# Patient Record
Sex: Male | Born: 1945 | Race: Black or African American | Hispanic: No | Marital: Single | Smoking: Current every day smoker
Health system: Southern US, Community
[De-identification: ages and names within clinical notes are randomized; demographics above are authoritative.]

## PROBLEM LIST (undated history)

## (undated) DIAGNOSIS — E210 Primary hyperparathyroidism: Secondary | ICD-10-CM

## (undated) DIAGNOSIS — R600 Localized edema: Secondary | ICD-10-CM

## (undated) DIAGNOSIS — R252 Cramp and spasm: Secondary | ICD-10-CM

## (undated) DIAGNOSIS — M79604 Pain in right leg: Secondary | ICD-10-CM

## (undated) DIAGNOSIS — R000 Tachycardia, unspecified: Secondary | ICD-10-CM

## (undated) DIAGNOSIS — M1990 Unspecified osteoarthritis, unspecified site: Secondary | ICD-10-CM

## (undated) DIAGNOSIS — Z720 Tobacco use: Secondary | ICD-10-CM

## (undated) DIAGNOSIS — E876 Hypokalemia: Secondary | ICD-10-CM

## (undated) DIAGNOSIS — I10 Essential (primary) hypertension: Secondary | ICD-10-CM

## (undated) DIAGNOSIS — N400 Enlarged prostate without lower urinary tract symptoms: Secondary | ICD-10-CM

## (undated) DIAGNOSIS — H409 Unspecified glaucoma: Secondary | ICD-10-CM

## (undated) DIAGNOSIS — D7589 Other specified diseases of blood and blood-forming organs: Secondary | ICD-10-CM

## (undated) DIAGNOSIS — H538 Other visual disturbances: Secondary | ICD-10-CM

## (undated) HISTORY — DX: Essential (primary) hypertension: I10

## (undated) HISTORY — DX: Unspecified glaucoma: H40.9

## (undated) HISTORY — DX: Other specified diseases of blood and blood-forming organs: D75.89

## (undated) HISTORY — DX: Primary hyperparathyroidism: E21.0

## (undated) HISTORY — PX: POLYPECTOMY: SHX149

## (undated) HISTORY — DX: Localized edema: R60.0

## (undated) HISTORY — DX: Benign prostatic hyperplasia without lower urinary tract symptoms: N40.0

## (undated) HISTORY — DX: Tobacco use: Z72.0

## (undated) HISTORY — DX: Other visual disturbances: H53.8

## (undated) HISTORY — PX: COLONOSCOPY: SHX174

## (undated) HISTORY — DX: Cramp and spasm: R25.2

## (undated) HISTORY — DX: Unspecified osteoarthritis, unspecified site: M19.90

## (undated) HISTORY — DX: Hypokalemia: E87.6

## (undated) HISTORY — DX: Pain in right leg: M79.604

## (undated) HISTORY — DX: Tachycardia, unspecified: R00.0

---

## 1995-02-09 HISTORY — PX: FRACTURE SURGERY: SHX138

## 2000-09-09 ENCOUNTER — Emergency Department (HOSPITAL_COMMUNITY): Admission: EM | Admit: 2000-09-09 | Discharge: 2000-09-09 | Payer: Self-pay | Admitting: Emergency Medicine

## 2000-09-10 ENCOUNTER — Encounter: Payer: Self-pay | Admitting: Emergency Medicine

## 2000-09-21 ENCOUNTER — Encounter: Payer: Self-pay | Admitting: *Deleted

## 2000-09-22 ENCOUNTER — Inpatient Hospital Stay (HOSPITAL_COMMUNITY): Admission: EM | Admit: 2000-09-22 | Discharge: 2000-10-08 | Payer: Self-pay

## 2000-09-23 ENCOUNTER — Encounter: Payer: Self-pay | Admitting: Family Medicine

## 2000-09-28 ENCOUNTER — Encounter: Payer: Self-pay | Admitting: Family Medicine

## 2000-09-29 ENCOUNTER — Encounter: Payer: Self-pay | Admitting: Family Medicine

## 2000-09-30 ENCOUNTER — Encounter: Payer: Self-pay | Admitting: Thoracic Surgery

## 2000-10-01 ENCOUNTER — Encounter: Payer: Self-pay | Admitting: Family Medicine

## 2000-10-02 ENCOUNTER — Encounter: Payer: Self-pay | Admitting: Thoracic Surgery

## 2000-10-03 ENCOUNTER — Encounter: Payer: Self-pay | Admitting: Thoracic Surgery

## 2000-10-04 ENCOUNTER — Encounter: Payer: Self-pay | Admitting: Thoracic Surgery

## 2000-10-05 ENCOUNTER — Encounter: Payer: Self-pay | Admitting: Thoracic Surgery

## 2000-10-06 ENCOUNTER — Encounter: Payer: Self-pay | Admitting: Thoracic Surgery

## 2000-10-07 ENCOUNTER — Encounter: Payer: Self-pay | Admitting: Family Medicine

## 2000-10-14 ENCOUNTER — Encounter: Payer: Self-pay | Admitting: Thoracic Surgery

## 2000-10-14 ENCOUNTER — Encounter: Admission: RE | Admit: 2000-10-14 | Discharge: 2000-10-14 | Payer: Self-pay | Admitting: Thoracic Surgery

## 2000-11-09 ENCOUNTER — Encounter: Payer: Self-pay | Admitting: Thoracic Surgery

## 2000-11-09 ENCOUNTER — Encounter: Admission: RE | Admit: 2000-11-09 | Discharge: 2000-11-09 | Payer: Self-pay | Admitting: Thoracic Surgery

## 2001-04-02 ENCOUNTER — Emergency Department (HOSPITAL_COMMUNITY): Admission: EM | Admit: 2001-04-02 | Discharge: 2001-04-03 | Payer: Self-pay | Admitting: *Deleted

## 2003-12-31 ENCOUNTER — Emergency Department (HOSPITAL_COMMUNITY): Admission: EM | Admit: 2003-12-31 | Discharge: 2003-12-31 | Payer: Self-pay | Admitting: *Deleted

## 2005-04-12 ENCOUNTER — Encounter: Admission: RE | Admit: 2005-04-12 | Discharge: 2005-04-12 | Payer: Self-pay | Admitting: Cardiology

## 2005-07-20 ENCOUNTER — Encounter (HOSPITAL_COMMUNITY): Admission: RE | Admit: 2005-07-20 | Discharge: 2005-10-18 | Payer: Self-pay | Admitting: Cardiology

## 2006-01-20 ENCOUNTER — Ambulatory Visit (HOSPITAL_COMMUNITY): Admission: RE | Admit: 2006-01-20 | Discharge: 2006-01-20 | Payer: Self-pay | Admitting: Cardiology

## 2006-03-04 ENCOUNTER — Ambulatory Visit (HOSPITAL_COMMUNITY): Admission: RE | Admit: 2006-03-04 | Discharge: 2006-03-04 | Payer: Self-pay | Admitting: Cardiology

## 2006-03-16 ENCOUNTER — Ambulatory Visit: Payer: Self-pay | Admitting: Gastroenterology

## 2006-03-30 ENCOUNTER — Encounter (INDEPENDENT_AMBULATORY_CARE_PROVIDER_SITE_OTHER): Payer: Self-pay | Admitting: Specialist

## 2006-03-30 ENCOUNTER — Ambulatory Visit: Payer: Self-pay | Admitting: Gastroenterology

## 2006-05-06 ENCOUNTER — Emergency Department (HOSPITAL_COMMUNITY): Admission: EM | Admit: 2006-05-06 | Discharge: 2006-05-07 | Payer: Self-pay | Admitting: *Deleted

## 2007-08-14 ENCOUNTER — Emergency Department (HOSPITAL_COMMUNITY): Admission: EM | Admit: 2007-08-14 | Discharge: 2007-08-14 | Payer: Self-pay | Admitting: Emergency Medicine

## 2008-10-17 ENCOUNTER — Emergency Department (HOSPITAL_COMMUNITY): Admission: EM | Admit: 2008-10-17 | Discharge: 2008-10-17 | Payer: Self-pay | Admitting: Emergency Medicine

## 2009-03-04 ENCOUNTER — Encounter (INDEPENDENT_AMBULATORY_CARE_PROVIDER_SITE_OTHER): Payer: Self-pay | Admitting: *Deleted

## 2009-09-24 ENCOUNTER — Encounter (INDEPENDENT_AMBULATORY_CARE_PROVIDER_SITE_OTHER): Payer: Self-pay | Admitting: *Deleted

## 2009-10-07 ENCOUNTER — Telehealth: Payer: Self-pay | Admitting: Gastroenterology

## 2009-11-13 ENCOUNTER — Encounter (INDEPENDENT_AMBULATORY_CARE_PROVIDER_SITE_OTHER): Payer: Self-pay | Admitting: *Deleted

## 2009-11-27 ENCOUNTER — Encounter (INDEPENDENT_AMBULATORY_CARE_PROVIDER_SITE_OTHER): Payer: Self-pay | Admitting: *Deleted

## 2009-12-01 ENCOUNTER — Ambulatory Visit: Payer: Self-pay | Admitting: Gastroenterology

## 2009-12-12 ENCOUNTER — Telehealth (INDEPENDENT_AMBULATORY_CARE_PROVIDER_SITE_OTHER): Payer: Self-pay | Admitting: *Deleted

## 2009-12-15 ENCOUNTER — Ambulatory Visit: Payer: Self-pay | Admitting: Gastroenterology

## 2009-12-17 ENCOUNTER — Encounter: Payer: Self-pay | Admitting: Gastroenterology

## 2010-03-10 NOTE — Letter (Signed)
Summary: Moviprep Instructions  [facility]  [address]   [place], [place] [address]   Phone: [phone]  Fax: [phone]       Bryan Vincent    1946/01/11    MRN: [id number]        Procedure Day Dorna Bloom: Monday, 12-15-09     Arrival Time: 12:30 p.m.      Procedure Time: 1:30 p.m.     Location of Procedure:                    x   [facility] (4th Floor)                        PREPARATION FOR COLONOSCOPY WITH MOVIPREP   Starting 5 days prior to your procedure 12-10-09  do not eat nuts, seeds, popcorn, corn, beans, peas,  salads, or any raw vegetables.  Do not take any fiber supplements (e.g. Metamucil, Citrucel, and Benefiber).  THE DAY BEFORE YOUR PROCEDURE         DATE: 12-14-09  DAY: Sunday  1.  Drink clear liquids the entire day-NO SOLID FOOD  2.  Do not drink anything colored red or purple.  Avoid juices with pulp.  No orange juice.  3.  Drink at least 64 oz. (8 glasses) of fluid/clear liquids during the day to prevent dehydration and help the prep work efficiently.  CLEAR LIQUIDS INCLUDE: Water Jello Ice Popsicles Tea (sugar ok, no milk/cream) Powdered fruit flavored drinks Coffee (sugar ok, no milk/cream) Gatorade Juice: apple, white grape, white cranberry  Lemonade Clear bullion, consomm, broth Carbonated beverages (any kind) Strained chicken noodle soup Hard Candy                             4.  In the morning, mix first dose of MoviPrep solution:    Empty 1 Pouch A and 1 Pouch B into the disposable container    Add lukewarm drinking water to the top line of the container. Mix to dissolve    Refrigerate (mixed solution should be used within 24 hrs)  5.  Begin drinking the prep at 5:00 p.m. The MoviPrep container is divided by 4 marks.   Every 15 minutes drink the solution down to the next mark (approximately 8 oz) until the full liter is complete.   6.  Follow completed prep with 16 oz of clear liquid of your choice  (Nothing red or purple).  Continue to drink clear liquids until bedtime.  7.  Before going to bed, mix second dose of MoviPrep solution:    Empty 1 Pouch A and 1 Pouch B into the disposable container    Add lukewarm drinking water to the top line of the container. Mix to dissolve    Refrigerate  THE DAY OF YOUR PROCEDURE      DATE: 12-15-09 DAY: Monday   Beginning at  8:30 a.m. (5 hours before procedure):         1. Every 15 minutes, drink the solution down to the next mark (approx 8 oz) until the full liter is complete.  2. Follow completed prep with 16 oz. of clear liquid of your choice.    3. You may drink clear liquids until  11:30 a.m. (2 HOURS BEFORE PROCEDURE).   MEDICATION INSTRUCTIONS  Unless otherwise instructed, you should take regular prescription medications with a small sip of water   as early  as possible the morning of your procedure.          OTHER INSTRUCTIONS  You will need a responsible adult at least 64 years of age to accompany you and drive you home.   This person must remain in the waiting room during your procedure.  Wear loose fitting clothing that is easily removed.  Leave jewelry and other valuables at home.  However, you may wish to bring a book to read or  an iPod/MP3 player to listen to music as you wait for your procedure to start.  Remove all body piercing jewelry and leave at home.  Total time from sign-in until discharge is approximately 2-3 hours.  You should go home directly after your procedure and rest.  You can resume normal activities the  day after your procedure.  The day of your procedure you should not:   Drive   Make legal decisions   Operate machinery   Drink alcohol   Return to work  You will receive specific instructions about eating, activities and medications before you leave.    The above instructions have been reviewed and explained to me by   Sherren Kerns RN  December 01, 2009 4:38 PM    I fully  understand and can verbalize these instructions _____________________________ Date _________

## 2010-03-10 NOTE — Progress Notes (Signed)
Summary: Reschedule Colon  Phone Note Outgoing Call Call back at Home Phone [phone]   Call placed by: Chales Abrahams CMA Duncan Dull),  December 12, 2009 12:59 PM Summary of Call: placed a call to the pt and rescheduled his procedure to 930 am.  He was reinstructed and he verbalized his understanding of the new times. Initial call taken by: Chales Abrahams CMA (AAMA),  December 12, 2009 1:00 PM

## 2010-03-10 NOTE — Letter (Signed)
Summary: Pre Visit Letter Revised  [facility]  [address] [place], [place] [address]   Phone: [phone]  Fax: [phone]        11/13/2009 MRN: [id number] Bryan Vincent [address] [place], [place]  [address]             Procedure Date:  12-15-09   Welcome to the Gastroenterology Division at [facility].    You are scheduled to see a nurse for your pre-procedure visit on 12-01-09 at 4:30p.m. on the 3rd floor at [facility], [address]  We ask that you try to arrive at our office 15 minutes prior to your appointment time to allow for check-in.  Please take a minute to review the attached form.  If you answer "Yes" to one or more of the questions on the first page, we ask that you call the person listed at your earliest opportunity.  If you answer "No" to all of the questions, please complete the rest of the form and bring it to your appointment.    Your nurse visit will consist of discussing your medical and surgical history, your immediate family medical history, and your medications.   If you are unable to list all of your medications on the form, please bring the medication bottles to your appointment and we will list them.  We will need to be aware of both prescribed and over the counter drugs.  We will need to know exact dosage information as well.    Please be prepared to read and sign documents such as consent forms, a financial agreement, and acknowledgement forms.  If necessary, and with your consent, a friend or relative is welcome to sit-in on the nurse visit with you.  Please bring your insurance card so that we may make a copy of it.  If your insurance requires a referral to see a specialist, please bring your referral form from your primary care physician.  No co-pay is required for this nurse visit.     If you cannot keep your appointment, please call [phone] to cancel or reschedule prior to your appointment date.  This  allows [place] the opportunity to schedule an appointment for another patient in need of care.    Thank you for choosing [facility] for your medical needs.  We appreciate the opportunity to care for you.  Please visit [place] at our website  to learn more about our practice.  Sincerely, The Gastroenterology Division

## 2010-03-10 NOTE — Procedures (Signed)
Summary: Colonoscopy  Patient: [name] Note: All result statuses are Final unless otherwise noted.  Tests: (1) Colonoscopy (COL)   COL Colonoscopy           DONE     [facility]     [address]     [place], [place]  [address]           COLONOSCOPY PROCEDURE REPORT           PATIENT:  Bryan Vincent, Bryan Vincent  MR#:  [id number]     BIRTHDATE:  05/12/1945, 64 yrs. old  GENDER:  male     ENDOSCOPIST:  Rachael Fee, MD     PROCEDURE DATE:  12/15/2009     PROCEDURE:  Colonoscopy with snare polypectomy     ASA CLASS:  Class II     INDICATIONS:  history of pre-cancerous (adenomatous) colon polyps     (adenomatous polyps, removed 2008)     MEDICATIONS:   Fentanyl 75 mcg IV, Versed 5 mg IV           DESCRIPTION OF PROCEDURE:   After the risks benefits and     alternatives of the procedure were thoroughly explained, informed     consent was obtained.  Digital rectal exam was performed and     revealed no rectal masses.   The [id number] endoscope was     introduced through the anus and advanced to the cecum, which was     identified by both the appendix and ileocecal valve, without     limitations.  The quality of the prep was adequate, using     MoviPrep.  The instrument was then slowly withdrawn as the colon     was fully examined.     <<PROCEDUREIMAGES>>     FINDINGS: Five sessile polyps were found, all were removed with     cold snare and all were sent to pathology (jar 1). These ranged in     size from 3mm to 8mm, located in ascending and descending segments     (see image5 and image4).  Mild diverticulosis was found throughout     the colon (see image6).  This was otherwise a normal examination     of the colon (see image7, image3, and image2).   Retroflexed views     in the rectum revealed no abnormalities.    The scope was then     withdrawn from the patient and the procedure completed.     COMPLICATIONS:  None           ENDOSCOPIC IMPRESSION:     1) Five polyps, all  removed and  all sent to pathology     2) Mild diverticulosis throughout the colon     3) Otherwise normal examination           RECOMMENDATIONS:     1) If the polyp(s) removed today are proven to be adenomatous     (pre-cancerous) polyps, you will need a colonoscopy in 3 years.     2) You will receive a letter within 1-2 weeks with the results     of your biopsy as well as final recommendations. Please call my     office if you have not received a letter after 3 weeks.           _____________________________     Rachael Fee, MD           cc: Donia Guiles, MD  n.     eSIGNED:   Rachael Fee at 12/15/2009 09:33 AM           Daleen Squibb, Remi Deter, [id number]  Note: An exclamation mark (!) indicates a result that was not dispersed into the flowsheet. Document Creation Date: 12/15/2009 9:33 AM _______________________________________________________________________  (1) Order result status: Final Collection or observation date-time: 12/15/2009 09:26 Requested date-time:  Receipt date-time:  Reported date-time:  Referring Physician:   Ordering Physician: Rob Bunting [phone]) Specimen Source:  Source: Launa Grill Order Number: [id number] Lab site:   Appended Document: Colonoscopy     Procedures Next Due Date:    Colonoscopy: 12/2012

## 2010-03-10 NOTE — Progress Notes (Signed)
Summary: Schedule Colonoscopy  Phone Note Outgoing Call Call back at Home Phone [phone]   Call placed by: Harlow Mares CMA Duncan Dull),  October 07, 2009 9:07 AM Call placed to: Patient Summary of Call: Patients number is disconnected, we will mail them a letter to remind them they are due for their procedure and they need to call back and schedule.  Initial call taken by: Harlow Mares CMA (AAMA),  October 07, 2009 9:08 AM

## 2010-03-10 NOTE — Letter (Signed)
Summary: Pre Visit No Show Letter  [facility]  [address] [place], [place] [address]   Phone: [phone]  Fax: [phone]        September 24, 2009 MRN: [id number]    Bryan Vincent [address] [place], [place]  [address]    Dear [name],   We have been unable to reach you by phone concerning the pre-procedure visit that you missed on 09/24/09. For this reason,your procedure scheduled on8/31/11 has been cancelled. Our scheduling staff will gladly assist you with rescheduling your appointments at a more convenient time. Please call our office at [phone] between the hours of 8:00am and 5:00pm, press option #2 to reach an appointment scheduler. Please consider updating your contact numbers at this time so that we can reach you by phone in the future with schedule changes or results.    Thank you,    Wyona Almas RN [facility]

## 2010-03-10 NOTE — Letter (Signed)
Summary: Colonoscopy Letter  [facility]  [address] [place], [place] [address]   Phone: [phone]  Fax: [phone]      March 04, 2009 MRN: [id number]   [facility] [address] [place], [place]  [address]   Dear Mr. ANNE,   According to your medical record, it is time for you to schedule a Colonoscopy. The American Cancer Society recommends this procedure as a method to detect early colon cancer. Patients with a family history of colon cancer, or a personal history of colon polyps or inflammatory bowel disease are at increased risk.  This letter has beeen generated based on the recommendations made at the time of your procedure. If you feel that in your particular situation this may no longer apply, please contact our office.  Please call our office at [phone] to schedule this appointment or to update your records at your earliest convenience.  Thank you for cooperating with [place] to provide you with the very best care possible.   Sincerely,  Rachael Fee, M.D.  [facility] Division [phone]

## 2010-03-10 NOTE — Letter (Signed)
Summary: Results Letter  [facility]  [address] [place], [place] [address]   Phone: [phone]  Fax: [phone]        December 17, 2009 MRN: [id number]    [facility] [address] [place], [place]  [address]    Dear Bryan Vincent,   The polyp(s) removed during your recent procedure were proven to be adenomatous.  These are pre-cancerous polyps that may have grown into cancers if they had not been removed.  Based on current nationally recognized surveillance guidelines, I recommend that you have a repeat colonoscopy in 3 years.   We will therefore put your information in our reminder system and will contact you in 3 years to schedule a repeat procedure.  Please call if you have any questions or concerns.       Sincerely,  Rachael Fee MD  This letter has been electronically signed by your physician.  Appended Document: Results Letter letter mailed

## 2010-03-10 NOTE — Miscellaneous (Signed)
Summary: previsit prep/rm  Clinical Lists Changes  Medications: Added new medication of MOVIPREP 100 GM  SOLR (PEG-KCL-NACL-NASULF-NA ASC-C) As per prep instructions. - Signed Rx of MOVIPREP 100 GM  SOLR (PEG-KCL-NACL-NASULF-NA ASC-C) As per prep instructions.;  #1 x 0;  Signed;  Entered by: Sherren Kerns RN;  Authorized by: Rachael Fee MD;  Method used: Electronically to [facility] [phone]*, [address] [place], [place]  [address], Ph: [phone] or [phone], Fax: [phone] Observations: Added new observation of ALLERGY REV: Done (12/01/2009 16:21) Added new observation of NKA: T (12/01/2009 16:21)    Prescriptions: MOVIPREP 100 GM  SOLR (PEG-KCL-NACL-NASULF-NA ASC-C) As per prep instructions.  #1 x 0   Entered by:   Sherren Kerns RN   Authorized by:   Rachael Fee MD   Signed by:   Sherren Kerns RN on 12/01/2009   Method used:   [facility] to        [facility] [phone]* (retail)       [address]       [place], [place]  [address]       Ph: [phone] or [phone]       Fax: [phone]   RxID:   [id number]

## 2010-11-05 LAB — DIFFERENTIAL
Basophils Absolute: 0
Lymphs Abs: 3.5
Monocytes Relative: 10
Neutrophils Relative %: 15 — ABNORMAL LOW

## 2010-11-05 LAB — PATHOLOGIST SMEAR REVIEW

## 2010-11-05 LAB — POCT CARDIAC MARKERS
CKMB, poc: 2
Myoglobin, poc: 115
Operator id: 277751
Troponin i, poc: <0.05

## 2010-11-05 LAB — CBC
Hemoglobin: 14.1
MCHC: 34.4
MCV: 100.9 — ABNORMAL HIGH
RDW: 13.7

## 2010-11-05 LAB — POCT I-STAT, CHEM 8
BUN: 14
Chloride: 107
Glucose, Bld: 78
Hemoglobin: 15
Potassium: 3.8
Sodium: 142

## 2011-05-19 ENCOUNTER — Other Ambulatory Visit: Payer: Self-pay | Admitting: Cardiology

## 2011-05-19 ENCOUNTER — Ambulatory Visit
Admission: RE | Admit: 2011-05-19 | Discharge: 2011-05-19 | Disposition: A | Discharge status: Home / Self Care | Payer: Medicare Other | Source: Ambulatory Visit | Attending: Cardiology | Admitting: Cardiology

## 2011-05-19 DIAGNOSIS — I10 Essential (primary) hypertension: Secondary | ICD-10-CM

## 2011-05-19 DIAGNOSIS — R0602 Shortness of breath: Secondary | ICD-10-CM

## 2011-06-24 ENCOUNTER — Ambulatory Visit (HOSPITAL_COMMUNITY)
Admission: RE | Admit: 2011-06-24 | Discharge: 2011-06-24 | Disposition: A | Discharge status: Home / Self Care | Payer: Medicare Other | Source: Ambulatory Visit | Attending: Cardiology | Admitting: Cardiology

## 2011-06-24 DIAGNOSIS — M79609 Pain in unspecified limb: Secondary | ICD-10-CM

## 2011-06-24 DIAGNOSIS — I739 Peripheral vascular disease, unspecified: Secondary | ICD-10-CM

## 2011-06-24 DIAGNOSIS — I70209 Unspecified atherosclerosis of native arteries of extremities, unspecified extremity: Secondary | ICD-10-CM | POA: Insufficient documentation

## 2011-06-24 NOTE — Progress Notes (Signed)
VASCULAR LAB PRELIMINARY  ABI completed:    RIGHT    LEFT    PRESSURE WAVEFORM  PRESSURE WAVEFORM  BRACHIAL 126 Tri BRACHIAL 120 Tri  DP 125 Tri DP 98 Tri  AT   AT    PT 114 Tri PT    PER   PER 103 Mono  GREAT TOE  NA GREAT TOE  NA    RIGHT LEFT  ABI 0.99, normal 0.82, mild arterial disease     Farrel Demark, RDMS 06/24/2011, 10:26 AM

## 2011-08-26 ENCOUNTER — Ambulatory Visit
Admission: RE | Admit: 2011-08-26 | Discharge: 2011-08-26 | Disposition: A | Discharge status: Home / Self Care | Payer: Medicare Other | Source: Ambulatory Visit | Attending: Cardiology | Admitting: Cardiology

## 2011-08-26 ENCOUNTER — Other Ambulatory Visit: Payer: Self-pay | Admitting: Cardiology

## 2011-08-26 DIAGNOSIS — R52 Pain, unspecified: Secondary | ICD-10-CM

## 2012-03-14 ENCOUNTER — Other Ambulatory Visit (HOSPITAL_COMMUNITY): Payer: Self-pay | Admitting: Cardiology

## 2012-03-14 DIAGNOSIS — J40 Bronchitis, not specified as acute or chronic: Secondary | ICD-10-CM

## 2012-03-17 ENCOUNTER — Encounter (HOSPITAL_COMMUNITY): Payer: Medicare Other

## 2012-03-21 ENCOUNTER — Encounter (HOSPITAL_COMMUNITY): Payer: Medicare Other

## 2012-03-29 ENCOUNTER — Ambulatory Visit
Admission: RE | Admit: 2012-03-29 | Discharge: 2012-03-29 | Disposition: A | Discharge status: Home / Self Care | Payer: Medicare Other | Source: Ambulatory Visit | Attending: Cardiology | Admitting: Cardiology

## 2012-03-29 ENCOUNTER — Other Ambulatory Visit: Payer: Self-pay | Admitting: Cardiology

## 2012-09-27 ENCOUNTER — Encounter: Payer: Self-pay | Admitting: Gastroenterology

## 2012-10-02 ENCOUNTER — Encounter: Payer: Self-pay | Admitting: Internal Medicine

## 2012-12-11 ENCOUNTER — Ambulatory Visit (AMBULATORY_SURGERY_CENTER): Payer: Self-pay | Admitting: *Deleted

## 2012-12-11 VITALS — Ht 71.0 in | Wt 167.8 lb

## 2012-12-11 DIAGNOSIS — Z86010 Personal history of colonic polyps: Secondary | ICD-10-CM

## 2012-12-11 MED ORDER — MOVIPREP 100 G PO SOLR: 1.0000 | Freq: Once | ORAL | Status: DC
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
## 2012-12-12 ENCOUNTER — Encounter: Payer: Self-pay | Admitting: Gastroenterology

## 2012-12-18 ENCOUNTER — Other Ambulatory Visit: Payer: Medicare Other | Admitting: Gastroenterology

## 2012-12-27 ENCOUNTER — Encounter: Payer: Self-pay | Admitting: Podiatrist

## 2012-12-27 ENCOUNTER — Ambulatory Visit (INDEPENDENT_AMBULATORY_CARE_PROVIDER_SITE_OTHER): Payer: Medicare Other | Admitting: Podiatrist

## 2012-12-27 VITALS — BP 153/88 | HR 84 | Resp 12 | Ht 71.0 in | Wt 171.0 lb

## 2012-12-27 DIAGNOSIS — B351 Tinea unguium: Secondary | ICD-10-CM

## 2012-12-27 DIAGNOSIS — M79609 Pain in unspecified limb: Secondary | ICD-10-CM

## 2012-12-27 NOTE — Patient Instructions (Signed)

## 2012-12-28 NOTE — Progress Notes (Signed)
HPI:  Patient presents today for follow up of foot and nail care. Denies any new complaints today.  Objective:  Patients chart is reviewed.  Neurovascular status unchanged.  Patients nails are thickened, discolored, distrophic, friable and brittle with yellow-brown discoloration. Patient subjectively relates they are painful with shoes and with ambulation of bilateral feet.  Assessment:  Symptomatic onychomycosis  Plan:  Discussed treatment options and alternatives.  The symptomatic toenails were debrided through manual an mechanical means without complication.  Return appointment recommended at routine intervals of 3 months    Massiel Stipp, DPM   

## 2013-01-09 ENCOUNTER — Ambulatory Visit (AMBULATORY_SURGERY_CENTER): Payer: Medicare Other | Admitting: Gastroenterology

## 2013-01-09 ENCOUNTER — Encounter: Payer: Self-pay | Admitting: Gastroenterology

## 2013-01-09 VITALS — BP 108/77 | HR 71 | Temp 98.1°F | Resp 10 | Ht 71.0 in | Wt 167.0 lb

## 2013-01-09 DIAGNOSIS — D126 Benign neoplasm of colon, unspecified: Secondary | ICD-10-CM

## 2013-01-09 DIAGNOSIS — R933 Abnormal findings on diagnostic imaging of other parts of digestive tract: Secondary | ICD-10-CM

## 2013-01-09 DIAGNOSIS — Z86010 Personal history of colonic polyps: Secondary | ICD-10-CM

## 2013-01-09 MED ORDER — SODIUM CHLORIDE 0.9 % IV SOLN: 500.0000 mL | INTRAVENOUS | Status: DC

## 2013-01-09 NOTE — Progress Notes (Signed)
Pressure applied to the abdomen to reach the cecum 

## 2013-01-09 NOTE — Progress Notes (Signed)
Patient did not have preoperative order for IV antibiotic SSI prophylaxis. (G8918)  Patient did not experience any of the following events: a burn prior to discharge; a fall within the facility; wrong site/side/patient/procedure/implant event; or a hospital transfer or hospital admission upon discharge from the facility. (G8907)  

## 2013-01-09 NOTE — Patient Instructions (Addendum)
One of your biggest health concerns is your smoking.  This increases your risk for most cancers and serious cardiovascular diseases such as strokes, heart attacks.  You should try your best to stop.  If you need assistance, please contact your PCP or Smoking Cessation Class at [place] ([phone]) or [place] [facility] (1-800-QUIT-NOW).  YOU HAD AN ENDOSCOPIC PROCEDURE TODAY AT THE  ENDOSCOPY CENTER: Refer to the procedure report that was given to you for any specific questions about what was found during the examination.  If the procedure report does not answer your questions, please call your gastroenterologist to clarify.  If you requested that your care partner not be given the details of your procedure findings, then the procedure report has been included in a sealed envelope for you to review at your convenience later.  YOU SHOULD EXPECT: Some feelings of bloating in the abdomen. Passage of more gas than usual.  Walking can help get rid of the air that was put into your GI tract during the procedure and reduce the bloating. If you had a lower endoscopy (such as a colonoscopy or flexible sigmoidoscopy) you may notice spotting of blood in your stool or on the toilet paper. If you underwent a bowel prep for your procedure, then you may not have a normal bowel movement for a few days.  DIET: Your first meal following the procedure should be a light meal and then it is ok to progress to your normal diet.  A half-sandwich or bowl of soup is an example of a good first meal.  Heavy or fried foods are harder to digest and may make you feel nauseous or bloated.  Likewise meals heavy in dairy and vegetables can cause extra gas to form and this can also increase the bloating.  Drink plenty of fluids but you should avoid alcoholic beverages for 24 hours.  ACTIVITY: Your care partner should take you home directly after the procedure.  You should plan to take it easy, moving slowly for the rest of  the day.  You can resume normal activity the day after the procedure however you should NOT DRIVE or use heavy machinery for 24 hours (because of the sedation medicines used during the test).    SYMPTOMS TO REPORT IMMEDIATELY: A gastroenterologist can be reached at any hour.  During normal business hours, 8:30 AM to 5:00 PM Monday through Friday, call [phone].  After hours and on weekends, please call the GI answering service at [phone] who will take a message and have the physician on call contact you.   Following lower endoscopy (colonoscopy or flexible sigmoidoscopy):  Excessive amounts of blood in the stool  Significant tenderness or worsening of abdominal pains  Swelling of the abdomen that is new, acute  Fever of 100F or higher  FOLLOW UP: If any biopsies were taken you will be contacted by phone or by letter within the next 1-3 weeks.  Call your gastroenterologist if you have not heard about the biopsies in 3 weeks.  Our staff will call the home number listed on your records the next business day following your procedure to check on you and address any questions or concerns that you may have at that time regarding the information given to you following your procedure. This is a courtesy call and so if there is no answer at the home number and we have not heard from you through the emergency physician on call, we will assume that you have returned to   your regular daily activities without incident.  SIGNATURES/CONFIDENTIALITY: You and/or your care partner have signed paperwork which will be entered into your electronic medical record.  These signatures attest to the fact that that the information above on your After Visit Summary has been reviewed and is understood.  Full responsibility of the confidentiality of this discharge information lies with you and/or your care-partner.  Recommendations See procedure report

## 2013-01-09 NOTE — Op Note (Signed)
[facility] [address] [place], [address]   COLONOSCOPY PROCEDURE REPORT  PATIENT: Bryan, Vincent  MR#: [id number] BIRTHDATE: 1946-01-06 , 67  yrs. old GENDER: Male ENDOSCOPIST: Rachael Fee, MD PROCEDURE DATE:  01/09/2013 PROCEDURE:   Colonoscopy with snare polypectomy First Screening Colonoscopy - Avg.  risk and is 50 yrs.  old or older - No.  Prior Negative Screening - Now for repeat screening. N/A  History of Adenoma - Now for follow-up colonoscopy & has been > or = to 3 yrs.  Yes hx of adenoma.  Has been 3 or more years since last colonoscopy.  Polyps Removed Today? Yes. ASA CLASS:   Class II INDICATIONS:adenomas removed 2008, five adenomas removed 2010. MEDICATIONS: Fentanyl 75 mcg IV, Versed 7 mg IV, and These medications were titrated to patient response per physician's verbal order  DESCRIPTION OF PROCEDURE:   After the risks benefits and alternatives of the procedure were thoroughly explained, informed consent was obtained.  A digital rectal exam revealed no abnormalities of the rectum.   The LB [id number]  endoscope was introduced through the anus and advanced to the cecum, which was identified by both the appendix and ileocecal valve. No adverse events experienced.   The quality of the prep was excellent.  The instrument was then slowly withdrawn as the colon was fully examined.  COLON FINDINGS: One polyp was found, removed and sent to pathology. This was sessile, 3mm across, located in ascending segment, removed with cold snare.  There was a focal area of extensive diverticulosis like changes, perhap chronic mucosal scarring around the hepatic flexure.  The examination was otherwise normal. Retroflexed views revealed no abnormalities. The time to cecum=6 minutes 07 seconds.  Withdrawal time=6 minutes 47 seconds.  The scope was withdrawn and the procedure completed. COMPLICATIONS: There were no complications.  ENDOSCOPIC  IMPRESSION: One polyp was found, removed and sent to pathology. There was a focal area of extensive diverticulosis like changes, perhap chronic mucosal scarring around the hepatic flexure.  The examination was otherwise normal.  RECOMMENDATIONS: Given your personal history of adenomatous (pre-cancerous) polyps, you will need a repeat colonoscopy in 5 years even if the polyps removed today are not pre-cancerous. You will receive a letter within 1-2 weeks with the results of your biopsy as well as final recommendations.  Please call my office if you have not received a letter after 3 weeks.   eSigned:  Rachael Fee, MD 01/09/2013 2:10 PM

## 2013-01-10 ENCOUNTER — Telehealth: Payer: Self-pay | Admitting: *Deleted

## 2013-01-10 NOTE — Telephone Encounter (Signed)
  Follow up Call-  Call back number 01/09/2013  Post procedure Call Back phone  # [phone]  Permission to leave phone message Yes     Patient questions:  Do you have a fever, pain , or abdominal swelling? no Pain Score  0 *  Have you tolerated food without any problems? yes  Have you been able to return to your normal activities? yes  Do you have any questions about your discharge instructions: Diet   no Medications  no Follow up visit  no  Do you have questions or concerns about your Care? no  Actions: * If pain score is 4 or above: No action needed, pain <4.

## 2013-01-17 ENCOUNTER — Encounter: Payer: Self-pay | Admitting: Gastroenterology

## 2013-03-28 ENCOUNTER — Ambulatory Visit: Payer: Medicare Other | Admitting: Podiatrist

## 2014-02-28 ENCOUNTER — Encounter: Payer: Self-pay | Admitting: Podiatrist

## 2014-02-28 ENCOUNTER — Ambulatory Visit (INDEPENDENT_AMBULATORY_CARE_PROVIDER_SITE_OTHER): Payer: Medicare HMO | Admitting: Podiatrist

## 2014-02-28 DIAGNOSIS — B351 Tinea unguium: Secondary | ICD-10-CM

## 2014-02-28 DIAGNOSIS — M79676 Pain in unspecified toe(s): Secondary | ICD-10-CM

## 2014-02-28 NOTE — Progress Notes (Signed)
HPI:  Patient presents today for follow up of foot and nail care. Denies any new complaints today.  Objective:  Patients chart is reviewed.  Neurovascular status unchanged.  Patients nails are thickened, discolored, distrophic, friable and brittle with yellow-brown discoloration. Patient subjectively relates they are painful with shoes and with ambulation of bilateral feet. Toenails affected are 1-5 bilateral and he has not been seen in over a year therefore the toenails are severely elongated and distrophic.   Assessment:  Symptomatic onychomycosis  Plan:  Discussed treatment options and alternatives.  The symptomatic toenails were debrided through manual an mechanical means without complication.  Return appointment recommended at routine intervals of 3 months

## 2014-05-16 ENCOUNTER — Ambulatory Visit: Payer: Medicare HMO | Admitting: Podiatrist

## 2014-10-01 ENCOUNTER — Other Ambulatory Visit (HOSPITAL_COMMUNITY)
Admission: AD | Admit: 2014-10-01 | Discharge: 2014-10-01 | Disposition: A | Discharge status: Home / Self Care | Payer: Medicare HMO | Source: Ambulatory Visit | Attending: Cardiology | Admitting: Cardiology

## 2014-10-01 ENCOUNTER — Ambulatory Visit (HOSPITAL_COMMUNITY)
Admission: RE | Admit: 2014-10-01 | Discharge: 2014-10-01 | Disposition: A | Discharge status: Home / Self Care | Payer: Medicare HMO | Source: Ambulatory Visit | Attending: Cardiology | Admitting: Cardiology

## 2014-10-01 ENCOUNTER — Other Ambulatory Visit (HOSPITAL_COMMUNITY): Payer: Self-pay | Admitting: Cardiology

## 2014-10-01 DIAGNOSIS — Z0000 Encounter for general adult medical examination without abnormal findings: Secondary | ICD-10-CM

## 2014-10-01 DIAGNOSIS — I10 Essential (primary) hypertension: Secondary | ICD-10-CM | POA: Diagnosis not present

## 2014-10-01 DIAGNOSIS — R739 Hyperglycemia, unspecified: Secondary | ICD-10-CM | POA: Insufficient documentation

## 2014-10-01 DIAGNOSIS — F17210 Nicotine dependence, cigarettes, uncomplicated: Secondary | ICD-10-CM | POA: Insufficient documentation

## 2014-10-01 LAB — TSH: TSH: 0.519 u[IU]/mL (ref 0.350–4.500)

## 2014-10-01 LAB — COMPREHENSIVE METABOLIC PANEL
ALT: 46 U/L (ref 17–63)
AST: 34 U/L (ref 15–41)
Albumin: 4 g/dL (ref 3.5–5.0)
Alkaline Phosphatase: 63 U/L (ref 38–126)
Anion gap: 8 (ref 5–15)
BUN: 7 mg/dL (ref 6–20)
CO2: 22 mmol/L (ref 22–32)
Calcium: 10.9 mg/dL — ABNORMAL HIGH (ref 8.9–10.3)
Chloride: 102 mmol/L (ref 101–111)
Creatinine, Ser: 1.29 mg/dL — ABNORMAL HIGH (ref 0.61–1.24)
GFR calc Af Amer: >60 mL/min (ref >60)
GFR calc non Af Amer: 55 mL/min — ABNORMAL LOW (ref >60)
GLUCOSE: 99 mg/dL (ref 65–99)
Potassium: 4 mmol/L (ref 3.5–5.1)
SODIUM: 132 mmol/L — AB (ref 135–145)
Total Bilirubin: 1.4 mg/dL — ABNORMAL HIGH (ref 0.3–1.2)
Total Protein: 7.2 g/dL (ref 6.5–8.1)

## 2014-10-02 LAB — PARATHYROID HORMONE, INTACT (NO CA): PTH: 78 pg/mL — AB (ref 15–65)

## 2014-10-02 LAB — T4: T4, Total: 5.7 ug/dL (ref 4.5–12.0)

## 2014-11-26 ENCOUNTER — Encounter: Payer: Self-pay | Admitting: Gastroenterology

## 2014-12-04 ENCOUNTER — Ambulatory Visit (INDEPENDENT_AMBULATORY_CARE_PROVIDER_SITE_OTHER): Payer: Medicare HMO | Admitting: Podiatry

## 2014-12-04 ENCOUNTER — Encounter: Payer: Self-pay | Admitting: Podiatry

## 2014-12-04 DIAGNOSIS — M79674 Pain in right toe(s): Secondary | ICD-10-CM

## 2014-12-04 DIAGNOSIS — M79675 Pain in left toe(s): Secondary | ICD-10-CM | POA: Diagnosis not present

## 2014-12-04 DIAGNOSIS — B351 Tinea unguium: Secondary | ICD-10-CM | POA: Diagnosis not present

## 2014-12-04 NOTE — Progress Notes (Signed)
Patient ID: Bryan Vincent, male   DOB: 01-May-1945, 69 y.o.   MRN: [id number]  Subjective: This patient presents today with request debridement of painful toenails which are uncomfortable walking wearing shoes. Last scheduled visit for this problem was on 02/28/2014  Objective: The toenails are extremely hypertrophic, elongated, discolored, deformed and tender direct palpation 6-10  Assessment: Neglected symptomatic onychomycoses 6-10  Plan: Debridement toenails 10 mechanically and electrically without any bleeding  Reappoint 4 months

## 2015-01-14 ENCOUNTER — Other Ambulatory Visit (HOSPITAL_COMMUNITY): Payer: Self-pay | Admitting: Cardiology

## 2015-01-14 DIAGNOSIS — R52 Pain, unspecified: Secondary | ICD-10-CM

## 2015-01-20 ENCOUNTER — Ambulatory Visit (HOSPITAL_COMMUNITY): Payer: Medicare HMO

## 2015-01-27 ENCOUNTER — Ambulatory Visit (HOSPITAL_COMMUNITY)
Admission: RE | Admit: 2015-01-27 | Discharge: 2015-01-27 | Disposition: A | Discharge status: Home / Self Care | Payer: Medicare HMO | Source: Ambulatory Visit | Attending: Cardiology | Admitting: Cardiology

## 2015-01-27 DIAGNOSIS — R52 Pain, unspecified: Secondary | ICD-10-CM

## 2015-01-27 DIAGNOSIS — M79604 Pain in right leg: Secondary | ICD-10-CM | POA: Insufficient documentation

## 2015-01-27 NOTE — Progress Notes (Signed)
VASCULAR LAB PRELIMINARY  PRELIMINARY  PRELIMINARY  PRELIMINARY  Right lower extremity venous duplex completed.    Preliminary report:  Right:  No evidence of DVT, superficial thrombosis, or Baker's cyst.  Jalacia Mattila, RVS 01/27/2015, 9:29 AM

## 2015-04-16 ENCOUNTER — Ambulatory Visit (INDEPENDENT_AMBULATORY_CARE_PROVIDER_SITE_OTHER): Payer: Medicare HMO | Admitting: Podiatry

## 2015-04-16 ENCOUNTER — Encounter: Payer: Self-pay | Admitting: Podiatry

## 2015-04-16 DIAGNOSIS — M79675 Pain in left toe(s): Secondary | ICD-10-CM | POA: Diagnosis not present

## 2015-04-16 DIAGNOSIS — B351 Tinea unguium: Secondary | ICD-10-CM | POA: Diagnosis not present

## 2015-04-16 DIAGNOSIS — M79674 Pain in right toe(s): Secondary | ICD-10-CM | POA: Diagnosis not present

## 2015-04-16 NOTE — Progress Notes (Signed)
Patient ID: Bryan Vincent, male   DOB: [birth year], 70 y.o.   MRN: [id number]  Subjective: This patient presents today with request debridement of painful toenails which are uncomfortable walking wearing shoes.  Objective: The toenails are extremely hypertrophic, elongated, discolored, deformed and tender direct palpation 6-10  Assessment:  symptomatic onychomycoses 6-10  Plan: Debridement toenails 10 mechanically and electrically without any bleeding  Reappoint 3 months

## 2015-04-26 ENCOUNTER — Emergency Department (HOSPITAL_COMMUNITY)
Admission: EM | Admit: 2015-04-26 | Discharge: 2015-04-26 | Disposition: A | Discharge status: Home / Self Care | Payer: Medicare HMO | Attending: Emergency Medicine | Admitting: Emergency Medicine

## 2015-04-26 ENCOUNTER — Encounter (HOSPITAL_COMMUNITY): Payer: Self-pay

## 2015-04-26 DIAGNOSIS — Z79899 Other long term (current) drug therapy: Secondary | ICD-10-CM | POA: Diagnosis not present

## 2015-04-26 DIAGNOSIS — Z7982 Long term (current) use of aspirin: Secondary | ICD-10-CM | POA: Insufficient documentation

## 2015-04-26 DIAGNOSIS — Z791 Long term (current) use of non-steroidal anti-inflammatories (NSAID): Secondary | ICD-10-CM | POA: Insufficient documentation

## 2015-04-26 DIAGNOSIS — R040 Epistaxis: Secondary | ICD-10-CM | POA: Diagnosis not present

## 2015-04-26 DIAGNOSIS — F17200 Nicotine dependence, unspecified, uncomplicated: Secondary | ICD-10-CM | POA: Insufficient documentation

## 2015-04-26 DIAGNOSIS — I10 Essential (primary) hypertension: Secondary | ICD-10-CM | POA: Diagnosis not present

## 2015-04-26 LAB — I-STAT CHEM 8, ED
BUN: 16 mg/dL (ref 6–20)
Calcium, Ion: 1.36 mmol/L — ABNORMAL HIGH (ref 1.13–1.30)
Chloride: 104 mmol/L (ref 101–111)
Creatinine, Ser: 1.2 mg/dL (ref 0.61–1.24)
Glucose, Bld: 106 mg/dL — ABNORMAL HIGH (ref 65–99)
HEMATOCRIT: 39 % (ref 39.0–52.0)
Hemoglobin: 13.3 g/dL (ref 13.0–17.0)
POTASSIUM: 3.5 mmol/L (ref 3.5–5.1)
SODIUM: 141 mmol/L (ref 135–145)
TCO2: 23 mmol/L (ref 0–100)

## 2015-04-26 MED ORDER — OXYMETAZOLINE HCL 0.05 % NA SOLN
2.0000 | Freq: Two times a day (BID) | NASAL | Status: DC
Start: 2015-04-26 — End: 2016-06-15

## 2015-04-26 NOTE — ED Notes (Signed)
Pt reports he has had multiple episodes of epistaxis since march 15, each episode lasting about 5 minutes but is able to control bleeding with pressure to bridge of nose. Last episode this morning lasting about 5 - 10 minutes. Denies other symptoms. No active bleeding at this time.
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
## 2015-04-26 NOTE — Discharge Instructions (Signed)

## 2015-07-16 ENCOUNTER — Ambulatory Visit: Payer: Medicare HMO | Admitting: Podiatry

## 2016-03-31 ENCOUNTER — Ambulatory Visit (INDEPENDENT_AMBULATORY_CARE_PROVIDER_SITE_OTHER): Payer: Medicare HMO | Admitting: Podiatry

## 2016-03-31 ENCOUNTER — Encounter: Payer: Self-pay | Admitting: Podiatry

## 2016-03-31 VITALS — BP 176/97 | HR 77 | Resp 18

## 2016-03-31 DIAGNOSIS — M79674 Pain in right toe(s): Secondary | ICD-10-CM | POA: Diagnosis not present

## 2016-03-31 DIAGNOSIS — M79675 Pain in left toe(s): Secondary | ICD-10-CM

## 2016-03-31 DIAGNOSIS — B351 Tinea unguium: Secondary | ICD-10-CM

## 2016-03-31 NOTE — Progress Notes (Signed)
   Subjective:    Patient ID: Bryan Vincent, male    DOB: 01-17-1946, 71 y.o.   MRN: [id number]  HPI   I am here to get my toenails trimmed and they are long and thick and has some discoloration    Review of Systems  All other systems reviewed and are negative.      Objective:   Physical Exam        Assessment & Plan:

## 2016-03-31 NOTE — Progress Notes (Signed)
Patient ID: Bryan Vincent, male   DOB: Apr 17, 1945, 71 y.o.   MRN: [id number]    Subjective: This patient presents today with request debridement of painful toenails which are uncomfortable walking wearing shoes. The last visit for a similar problem was on 04/16/2015. The patient is fiancs present to treatment room  Objective: Orientated 3 DP pulses 2/4 bilaterally PT pulses 1/4 bilaterally Capillary reflex immediate bilaterally Sensation to 10 g monofilament wire intact 4/5 right 5/5 left Vibratory sensation reactive bilaterally Ankle reflexes reactive bilaterally No open skin lesions bilaterally The toenails are extremely hypertrophic, elongated, discolored, deformed and tender direct palpation 6-10 Mild by his bilaterally Manual motor testing dorsi flexion, plantar flexion 5/5 bilaterally  Assessment: Neglected extremely symptomatic mycotic toenails 6-10  Plan: Debridement toenails 10 mechanically and electrically without any bleeding Discussed with patient the importance of coming more frequently for debridement  Reappoint 4 months

## 2016-05-24 ENCOUNTER — Ambulatory Visit: Payer: Medicare HMO | Admitting: Family

## 2016-06-15 ENCOUNTER — Ambulatory Visit (INDEPENDENT_AMBULATORY_CARE_PROVIDER_SITE_OTHER): Payer: Medicare HMO | Admitting: Family

## 2016-06-15 ENCOUNTER — Other Ambulatory Visit: Payer: Self-pay | Admitting: Family

## 2016-06-15 ENCOUNTER — Other Ambulatory Visit (INDEPENDENT_AMBULATORY_CARE_PROVIDER_SITE_OTHER): Payer: Medicare HMO

## 2016-06-15 ENCOUNTER — Encounter: Payer: Self-pay | Admitting: Family

## 2016-06-15 VITALS — BP 144/96 | HR 71 | Temp 98.0°F | Resp 16 | Ht 71.0 in | Wt 183.0 lb

## 2016-06-15 DIAGNOSIS — I10 Essential (primary) hypertension: Secondary | ICD-10-CM

## 2016-06-15 DIAGNOSIS — N400 Enlarged prostate without lower urinary tract symptoms: Secondary | ICD-10-CM | POA: Diagnosis not present

## 2016-06-15 DIAGNOSIS — R600 Localized edema: Secondary | ICD-10-CM

## 2016-06-15 HISTORY — DX: Benign prostatic hyperplasia without lower urinary tract symptoms: N40.0

## 2016-06-15 HISTORY — DX: Localized edema: R60.0

## 2016-06-15 LAB — COMPREHENSIVE METABOLIC PANEL
ALBUMIN: 3.9 g/dL (ref 3.5–5.2)
ALK PHOS: 72 U/L (ref 39–117)
ALT: 75 U/L — ABNORMAL HIGH (ref 0–53)
AST: 84 U/L — ABNORMAL HIGH (ref 0–37)
BILIRUBIN TOTAL: 1.5 mg/dL — AB (ref 0.2–1.2)
BUN: 11 mg/dL (ref 6–23)
CO2: 29 mEq/L (ref 19–32)
Calcium: 11 mg/dL — ABNORMAL HIGH (ref 8.4–10.5)
Chloride: 106 mEq/L (ref 96–112)
Creatinine, Ser: 1.09 mg/dL (ref 0.40–1.50)
GFR: 86.06 mL/min (ref >60.00)
GLUCOSE: 86 mg/dL (ref 70–99)
Potassium: 3.3 mEq/L — ABNORMAL LOW (ref 3.5–5.1)
SODIUM: 140 meq/L (ref 135–145)
TOTAL PROTEIN: 7.2 g/dL (ref 6.0–8.3)

## 2016-06-15 LAB — D-DIMER, QUANTITATIVE (NOT AT ARMC): D DIMER QUANT: 1.2 ug{FEU}/mL — AB (ref <0.50)

## 2016-06-15 MED ORDER — HYDROCHLOROTHIAZIDE 25 MG PO TABS: 25.0000 mg | ORAL_TABLET | Freq: Every day | ORAL | 1 refills | Status: DC

## 2016-06-15 NOTE — Patient Instructions (Signed)
Thank you for choosing [facility].  SUMMARY AND INSTRUCTIONS:  Please complete the blood work.  Keep your leg elevated when seated.   Decrease sodium in diet.   Continue to monitor your blood pressure at home.   Follow up in 1 month.   Medication:  Your prescription(s) have been submitted to your pharmacy or been printed and provided for you. Please take as directed and contact our office if you believe you are having problem(s) with the medication(s) or have any questions.  Labs:  Please stop by the lab on the lower level of the building for your blood work. Your results will be released to [place] (or called to you) after review, usually within 72 hours after test completion. If any changes need to be made, you will be notified at that same time.  1.) The lab is open from 7:30am to 5:30 pm Monday-Friday 2.) No appointment is necessary 3.) Fasting (if needed) is 6-8 hours after food and drink; black coffee and water are okay   Follow up:  If your symptoms worsen or fail to improve, please contact our office for further instruction, or in case of emergency go directly to the emergency room at the closest medical facility.
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
## 2016-06-15 NOTE — Assessment & Plan Note (Signed)
Stable and managed by urology with current dosage of finasteride and tamsulosin. No adverse side effects. Continue current dosage of finasteride and tamsulosin with chest follow-up and changes per urology.

## 2016-06-15 NOTE — Assessment & Plan Note (Signed)
Blood pressure remains poorly controlled current medication regimen and no adverse side effects. Blood pressures at home remain elevated as well. Denies worst headache of life with no symptoms of end organ damage noted physical exam. Continue current dosage of amlodipine-valsartan. Start hydrochlorothiazide pending complete metabolic profile results. Encouraged monitor blood pressure at home and follow low-sodium diet.

## 2016-06-23 ENCOUNTER — Ambulatory Visit (HOSPITAL_COMMUNITY)
Admission: RE | Admit: 2016-06-23 | Discharge: 2016-06-23 | Disposition: A | Discharge status: Home / Self Care | Payer: Medicare HMO | Source: Ambulatory Visit | Attending: Cardiovascular Disease | Admitting: Cardiovascular Disease

## 2016-06-23 DIAGNOSIS — R600 Localized edema: Secondary | ICD-10-CM

## 2016-06-30 ENCOUNTER — Ambulatory Visit (INDEPENDENT_AMBULATORY_CARE_PROVIDER_SITE_OTHER): Payer: Medicare HMO | Admitting: Podiatry

## 2016-06-30 DIAGNOSIS — M79675 Pain in left toe(s): Secondary | ICD-10-CM | POA: Diagnosis not present

## 2016-06-30 DIAGNOSIS — M79674 Pain in right toe(s): Secondary | ICD-10-CM

## 2016-06-30 DIAGNOSIS — B351 Tinea unguium: Secondary | ICD-10-CM | POA: Diagnosis not present

## 2016-06-30 NOTE — Progress Notes (Signed)
Patient ID: Bryan Vincent, male   DOB: 08-27-1945, 71 y.o.   MRN: [id number]    Subjective: This patient presents today with request debridement of painful toenails which are uncomfortable walking wearing shoes. The last visit for a similar problem was on 04/16/2015. The patient is fiancs present to treatment room  Objective: Orientated 3 Bilateral peripheral pitting edema DP pulses 2/4 bilaterally PT pulses 1/4 bilaterally Capillary reflex immediate bilaterally Sensation to 10 g monofilament wire intact 4/5 right 5/5 left Vibratory sensation reactive bilaterally Ankle reflexes reactive bilaterally No open skin lesions bilaterally Atrophic skin with absent hair growth bilaterally The toenails are extremely hypertrophic, elongated, discolored, deformed and tender direct palpation 6-10 Mild by his bilaterally Manual motor testing dorsi flexion, plantar flexion 5/5 bilaterally  Assessment: Neglected extremely symptomatic mycotic toenails 6-10  Plan: Debridement toenails 10 mechanically and electrically without any bleeding Discussed with patient the importance of coming more frequently for debridement  Reappoint 3 months

## 2016-07-20 ENCOUNTER — Encounter: Payer: Self-pay | Admitting: Family

## 2016-07-20 ENCOUNTER — Ambulatory Visit (INDEPENDENT_AMBULATORY_CARE_PROVIDER_SITE_OTHER): Payer: Medicare HMO | Admitting: Family

## 2016-07-20 ENCOUNTER — Other Ambulatory Visit: Payer: Self-pay | Admitting: Family

## 2016-07-20 ENCOUNTER — Other Ambulatory Visit (INDEPENDENT_AMBULATORY_CARE_PROVIDER_SITE_OTHER): Payer: Medicare HMO

## 2016-07-20 VITALS — BP 126/84 | HR 105 | Temp 98.0°F | Resp 16 | Ht 71.0 in | Wt 174.0 lb

## 2016-07-20 DIAGNOSIS — I10 Essential (primary) hypertension: Secondary | ICD-10-CM | POA: Diagnosis not present

## 2016-07-20 LAB — BASIC METABOLIC PANEL
BUN: 15 mg/dL (ref 6–23)
CHLORIDE: 101 meq/L (ref 96–112)
CO2: 31 meq/L (ref 19–32)
Calcium: 9.9 mg/dL (ref 8.4–10.5)
Creatinine, Ser: 1.15 mg/dL (ref 0.40–1.50)
GFR: 80.88 mL/min (ref >60.00)
GLUCOSE: 131 mg/dL — AB (ref 70–99)
POTASSIUM: 3 meq/L — AB (ref 3.5–5.1)
SODIUM: 140 meq/L (ref 135–145)

## 2016-07-20 MED ORDER — AMLODIPINE BESYLATE-VALSARTAN 10-320 MG PO TABS
1.0000 | ORAL_TABLET | Freq: Every day | ORAL | 1 refills | Status: DC
Start: 2016-07-20 — End: 2016-12-16

## 2016-07-20 MED ORDER — POTASSIUM CHLORIDE ER 10 MEQ PO TBCR: 10.0000 meq | EXTENDED_RELEASE_TABLET | Freq: Every day | ORAL | 1 refills | Status: DC

## 2016-07-20 MED ORDER — HYDROCHLOROTHIAZIDE 25 MG PO TABS: 25.0000 mg | ORAL_TABLET | Freq: Every day | ORAL | 0 refills | Status: DC

## 2016-07-20 NOTE — Assessment & Plan Note (Signed)
Blood pressure improved with addition of hydrochlorothiazide and below goal 140/90. Obtain basic metabolic profile. Continue current dosage of amlodipine-valsartan and hydrochlorothiazide. Continue to monitor blood pressure at home and follow low-sodium diet. Follow-up pending blood work results.

## 2016-07-20 NOTE — Progress Notes (Signed)
Subjective:    Patient ID: Bryan Vincent, male    DOB: 15-Jul-1945, 71 y.o.   MRN: [id number]  Chief Complaint  Patient presents with  . Follow-up    blood pressure    HPI:  Bryan Vincent is a 71 y.o. male who  has a past medical history of BPH (benign prostatic hyperplasia) and Hypertension. and presents today for an a follow up office visit.  Hypertension -  Currently maintained on amlodipine-valsartan and hydrochlorothiazide. Reports taking the medication as prescribed and denies adverse side effects. Has noted improvements in his lower extremity edema as well. Denies worst headache of life with no new symptoms of end organ damage. Working on a low sodium diet.  BP Readings from Last 3 Encounters:  07/20/16 126/84  06/15/16 (!) 144/96  03/31/16 (!) 176/97     No Known Allergies    Outpatient Medications Prior to Visit  Medication Sig Dispense Refill  . aspirin 81 MG tablet Take 81 mg by mouth daily.    . finasteride (PROSCAR) 5 MG tablet Take 5 mg by mouth daily.    Marland Kitchen ibuprofen (ADVIL,MOTRIN) 800 MG tablet Take 800 mg by mouth 3 (three) times daily.  12  . latanoprost (XALATAN) 0.005 % ophthalmic solution     . LUMIGAN 0.01 % SOLN     . tamsulosin (FLOMAX) 0.4 MG CAPS capsule Take 0.4 mg by mouth daily.    Marland Kitchen amLODipine-valsartan (EXFORGE) 10-320 MG per tablet Take 1 tablet by mouth daily.    . hydrochlorothiazide (HYDRODIURIL) 25 MG tablet Take 1 tablet (25 mg total) by mouth daily. 30 tablet 1   No facility-administered medications prior to visit.      Review of Systems  Constitutional: Negative for chills and fever.  Eyes:       Negative for changes in vision  Respiratory: Negative for cough, chest tightness and wheezing.   Cardiovascular: Negative for chest pain, palpitations and leg swelling.  Neurological: Negative for dizziness, weakness and light-headedness.      Objective:    BP 126/84 (BP Location: Left Arm, Patient Position: Sitting, Cuff Size:  Normal)   Pulse (!) 105   Temp 98 F (36.7 C) (Oral)   Resp 16   Ht 5\' 11"  (1.803 m)   Wt 174 lb (78.9 kg)   SpO2 93%   BMI 24.27 kg/m  Nursing note and vital signs reviewed.  Physical Exam  Constitutional: He is oriented to person, place, and time. He appears well-developed and well-nourished. No distress.  Cardiovascular: Normal rate, regular rhythm, normal heart sounds and intact distal pulses.  Exam reveals no gallop and no friction rub.   No murmur heard. Pulmonary/Chest: Effort normal and breath sounds normal. No respiratory distress. He has no wheezes. He has no rales. He exhibits no tenderness.  Neurological: He is alert and oriented to person, place, and time.  Skin: Skin is warm and dry.  Psychiatric: He has a normal mood and affect. His behavior is normal. Judgment and thought content normal.       Assessment & Plan:   Problem List Items Addressed This Visit      Cardiovascular and Mediastinum   Essential hypertension - Primary    Blood pressure improved with addition of hydrochlorothiazide and below goal 140/90. Obtain basic metabolic profile. Continue current dosage of amlodipine-valsartan and hydrochlorothiazide. Continue to monitor blood pressure at home and follow low-sodium diet. Follow-up pending blood work results.      Relevant Medications  amLODipine-valsartan (EXFORGE) 10-320 MG tablet   hydrochlorothiazide (HYDRODIURIL) 25 MG tablet   Other Relevant Orders   Basic Metabolic Panel (BMET)       I have changed Bryan Vincent amLODipine-valsartan. I am also having him maintain his tamsulosin, aspirin, ibuprofen, finasteride, LUMIGAN, latanoprost, and hydrochlorothiazide.   Meds ordered this encounter  Medications  . amLODipine-valsartan (EXFORGE) 10-320 MG tablet    Sig: Take 1 tablet by mouth daily.    Dispense:  90 tablet    Refill:  1    Order Specific Question:   Supervising Provider    Answer:   Pricilla Holm A [[id number]]  .  hydrochlorothiazide (HYDRODIURIL) 25 MG tablet    Sig: Take 1 tablet (25 mg total) by mouth daily.    Dispense:  90 tablet    Refill:  0    Order Specific Question:   Supervising Provider    Answer:   Pricilla Holm A [[id number]]     Follow-up: Return in about 6 months (around 01/19/2017), or if symptoms worsen or fail to improve.  Mauricio Po, FNP

## 2016-07-20 NOTE — Patient Instructions (Addendum)
Thank you for choosing [facility].  SUMMARY AND INSTRUCTIONS:  Continue to take your hydrochlorothiazide and Exforge.  Work on a low sodium diet.   Medication:  Your prescription(s) have been submitted to your pharmacy or been printed and provided for you. Please take as directed and contact our office if you believe you are having problem(s) with the medication(s) or have any questions.  Labs:  Please stop by the lab on the lower level of the building for your blood work. Your results will be released to [place] (or called to you) after review, usually within 72 hours after test completion. If any changes need to be made, you will be notified at that same time.  1.) The lab is open from 7:30am to 5:30 pm Monday-Friday 2.) No appointment is necessary 3.) Fasting (if needed) is 6-8 hours after food and drink; black coffee and water are okay   Follow up:  If your symptoms worsen or fail to improve, please contact our office for further instruction, or in case of emergency go directly to the emergency room at the closest medical facility.

## 2016-09-15 DIAGNOSIS — H401134 Primary open-angle glaucoma, bilateral, indeterminate stage: Secondary | ICD-10-CM | POA: Diagnosis not present

## 2016-09-21 ENCOUNTER — Ambulatory Visit: Payer: Medicare HMO | Admitting: Family

## 2016-09-22 ENCOUNTER — Ambulatory Visit: Payer: Medicare HMO | Admitting: Family

## 2016-09-25 ENCOUNTER — Other Ambulatory Visit: Payer: Self-pay | Admitting: Family

## 2016-10-04 ENCOUNTER — Ambulatory Visit: Payer: Medicare HMO | Admitting: Podiatry

## 2016-10-06 ENCOUNTER — Ambulatory Visit: Payer: Medicare HMO | Admitting: Podiatry

## 2016-10-06 ENCOUNTER — Telehealth: Payer: Self-pay | Admitting: Family

## 2016-10-16 ENCOUNTER — Other Ambulatory Visit: Payer: Self-pay | Admitting: Family

## 2016-10-19 ENCOUNTER — Encounter: Payer: Self-pay | Admitting: Podiatry

## 2016-10-19 ENCOUNTER — Ambulatory Visit (INDEPENDENT_AMBULATORY_CARE_PROVIDER_SITE_OTHER): Payer: Medicare HMO | Admitting: Podiatry

## 2016-10-19 DIAGNOSIS — M79675 Pain in left toe(s): Secondary | ICD-10-CM

## 2016-10-19 DIAGNOSIS — M79674 Pain in right toe(s): Secondary | ICD-10-CM | POA: Diagnosis not present

## 2016-10-19 DIAGNOSIS — B351 Tinea unguium: Secondary | ICD-10-CM | POA: Diagnosis not present

## 2016-10-19 NOTE — Progress Notes (Signed)
Patient ID: Bryan Vincent, male   DOB: Aug 22, 1945, 71 y.o.   MRN: [id number]    Subjective: This patient presents today with request debridement of painful toenails which are uncomfortable walking wearing shoes.The last visit for a similar problem was on 04/16/2015.The patient is fiancs present to treatment room  Objective: Orientated 3 Bilateral peripheral pitting edema DP pulses 2/4 bilaterally PT pulses 1/4 bilaterally Capillary reflex immediate bilaterally Sensation to 10 g monofilament wire intact 4/5 right 5/5 left Vibratory sensation reactive bilaterally Ankle reflexes reactive bilaterally No open skin lesions bilaterally Atrophic skin with absent hair growth bilaterally The toenails are extremely hypertrophic, elongated, discolored, deformed and tender direct palpation 6-10 Mild by his bilaterally Manual motor testing dorsi flexion, plantar flexion 5/5 bilaterally  Assessment: Neglected extremely symptomatic mycotic toenails 6-10  Plan: Debridement toenails 10 mechanically and electrically without any bleeding Discussed with patient the importance of coming more frequently for debridement  Reappoint 61months

## 2016-12-06 ENCOUNTER — Other Ambulatory Visit: Payer: Self-pay

## 2016-12-06 MED ORDER — POTASSIUM CHLORIDE CRYS ER 10 MEQ PO TBCR: 10.0000 meq | EXTENDED_RELEASE_TABLET | Freq: Every day | ORAL | 1 refills | Status: DC

## 2016-12-16 ENCOUNTER — Other Ambulatory Visit: Payer: Self-pay | Admitting: *Deleted

## 2016-12-16 MED ORDER — AMLODIPINE BESYLATE-VALSARTAN 10-320 MG PO TABS: 1.0000 | ORAL_TABLET | Freq: Every day | ORAL | 0 refills | Status: DC

## 2017-01-19 ENCOUNTER — Ambulatory Visit (INDEPENDENT_AMBULATORY_CARE_PROVIDER_SITE_OTHER): Payer: Medicare HMO | Admitting: Internal Medicine

## 2017-01-19 ENCOUNTER — Ambulatory Visit (HOSPITAL_COMMUNITY)
Admission: RE | Admit: 2017-01-19 | Discharge: 2017-01-19 | Disposition: A | Discharge status: Home / Self Care | Payer: Medicare HMO | Source: Ambulatory Visit | Attending: Oncology | Admitting: Oncology

## 2017-01-19 ENCOUNTER — Encounter (INDEPENDENT_AMBULATORY_CARE_PROVIDER_SITE_OTHER): Payer: Self-pay

## 2017-01-19 VITALS — BP 168/104 | HR 108 | Temp 98.7°F | Wt 168.7 lb

## 2017-01-19 DIAGNOSIS — N400 Enlarged prostate without lower urinary tract symptoms: Secondary | ICD-10-CM

## 2017-01-19 DIAGNOSIS — R600 Localized edema: Secondary | ICD-10-CM

## 2017-01-19 DIAGNOSIS — Z8249 Family history of ischemic heart disease and other diseases of the circulatory system: Secondary | ICD-10-CM

## 2017-01-19 DIAGNOSIS — R351 Nocturia: Secondary | ICD-10-CM

## 2017-01-19 DIAGNOSIS — I10 Essential (primary) hypertension: Secondary | ICD-10-CM

## 2017-01-19 DIAGNOSIS — R000 Tachycardia, unspecified: Secondary | ICD-10-CM | POA: Insufficient documentation

## 2017-01-19 DIAGNOSIS — Z79899 Other long term (current) drug therapy: Secondary | ICD-10-CM | POA: Diagnosis not present

## 2017-01-19 DIAGNOSIS — R748 Abnormal levels of other serum enzymes: Secondary | ICD-10-CM

## 2017-01-19 DIAGNOSIS — F17210 Nicotine dependence, cigarettes, uncomplicated: Secondary | ICD-10-CM | POA: Diagnosis not present

## 2017-01-19 DIAGNOSIS — Z7289 Other problems related to lifestyle: Secondary | ICD-10-CM

## 2017-01-19 DIAGNOSIS — F1090 Alcohol use, unspecified, uncomplicated: Secondary | ICD-10-CM

## 2017-01-19 DIAGNOSIS — I517 Cardiomegaly: Secondary | ICD-10-CM | POA: Insufficient documentation

## 2017-01-19 DIAGNOSIS — D7589 Other specified diseases of blood and blood-forming organs: Secondary | ICD-10-CM

## 2017-01-19 DIAGNOSIS — N401 Enlarged prostate with lower urinary tract symptoms: Secondary | ICD-10-CM

## 2017-01-19 HISTORY — DX: Tachycardia, unspecified: R00.0

## 2017-01-19 MED ORDER — TAMSULOSIN HCL 0.4 MG PO CAPS: 0.4000 mg | ORAL_CAPSULE | Freq: Every day | ORAL | 2 refills | Status: DC

## 2017-01-19 MED ORDER — AMLODIPINE BESYLATE-VALSARTAN 10-320 MG PO TABS: 1.0000 | ORAL_TABLET | Freq: Every day | ORAL | 3 refills | Status: DC

## 2017-01-19 MED ORDER — HYDROCHLOROTHIAZIDE 25 MG PO TABS: 25.0000 mg | ORAL_TABLET | Freq: Every day | ORAL | 0 refills | Status: DC

## 2017-01-19 MED ORDER — FINASTERIDE 5 MG PO TABS: 5.0000 mg | ORAL_TABLET | Freq: Every day | ORAL | 3 refills | Status: DC
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
## 2017-01-19 NOTE — Assessment & Plan Note (Addendum)
BP Readings from Last 3 Encounters:  01/19/17 (!) 168/104  07/20/16 126/84  06/15/16 (!) 144/96   His blood pressure on initial check was 177/110, on recheck it was 168/104.  He denies any headaches, blurry vision, chest pain or dyspnea.   According to chart review he was on Exforge 10-320 and hydrochlorothiazide 25 mg daily, apparently he ran out of Exforge for more 1 week, recently got his medicine on Saturday but never took it for 2 days, last dose was on Monday. According to patient he is compliant with his medication, he had hydrochlorothiazide with him which was filled up in June 2018 for 90 tablets and still have approximately 15-20 tablets in there.  He was also using hydrochlorothiazide at bedtime which resulted in nocturia, he normally gets up 3-4 times every night to urinate.  He was provided with a refill of Exforge and hydrochlorothiazide. He was advised to use hydrochlorothiazide in the morning to prevent nocturia and sleep disturbance. He was also advised to check his blood pressure at home and make a log, he was asked to bring that log with him during next follow-up visit in 1-2 weeks.  Addendum.  Patient had hypercalcemia, hydrochlorothiazide was discontinued.  Patient has his next follow-up appointment on January 26, 2017, he will need reassessment for his blood pressure needs.  Currently he is just using Exforge, might need titration of an addition to have his blood pressure under good control.

## 2017-01-19 NOTE — Assessment & Plan Note (Addendum)
On exam he was found to have mild asymptomatic sinus tachycardia. He was tachycardic at 105 during his previous office visit in June 2018.  EKG was obtained in the clinic which shows sinus tachycardia with borderline J-point elevation and V3 to V5.  The tachycardia might be due to high blood pressure.  He denies any heat or cold intolerance.  Denies any palpitations or chest pain.  We will check his CBC, CMP and TSH.  Addendum.  His lab work was positive for elevated MCV and MCH with  hemoglobin with an upper normal limit. CMP was positive for bilirubin of 1.9, mildly elevated AST and ALT with normal alkaline phosphatase, elevated calcium at 11.1.  Patient was called and advised to stop taking hydrochlorothiazide. We will do further lab investigation with ferritin, iron studies, ionized calcium, parathyroid hormone level, vitamin D, direct and indirect bilirubin, reticulocyte count, LDH and haptoglobin levels. Order was placed-patient is scheduled for January 26, 2017 for follow-up, we will draw lab with that appointment.  If his ferritin is elevated he will need further testing for hemochromatosis with [id number] and H63D. If calcium and parathyroid is elevated he will get benefit with technetium sestamibi scan for parathyroid.

## 2017-01-19 NOTE — Assessment & Plan Note (Signed)
Patient denies any urinary symptoms related to BPH.  He was on finasteride and tamsulosin for many years. He ran out of his finasteride for couple of weeks but using tamsulosin regularly.  He was given refills for finasteride and tamsulosin.
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
## 2017-01-19 NOTE — Patient Instructions (Addendum)
Thank you for visiting clinic today. Your blood pressure was high today, as you did not took your medicines today I am not making any changes to your existing regimen. Please take your medicines regularly as advised.  Take your water pill called hydrochlorothiazide in the morning so you do not have to get up multiple times at night. Please check your blood pressure at home and write it down in a notebook with date and time, and bring that log with you during next follow-up visit. We are doing some lab work today, we will call you with any abnormal results, if you do not hear from [place] that means that your blood workup is normal and you can ask your results during next follow-up visit. Please exercise regularly and watch your diet especially for the salt content. Please follow-up in 2 weeks for your blood pressure check.

## 2017-01-20 LAB — CMP14 + ANION GAP
ALT: 57 IU/L — ABNORMAL HIGH (ref 0–44)
AST: 60 IU/L — ABNORMAL HIGH (ref 0–40)
Albumin/Globulin Ratio: 1.5 (ref 1.2–2.2)
Albumin: 4.5 g/dL (ref 3.5–4.8)
Alkaline Phosphatase: 75 IU/L (ref 39–117)
Anion Gap: 18 mmol/L (ref 10.0–18.0)
BUN/Creatinine Ratio: 8 — ABNORMAL LOW (ref 10–24)
BUN: 10 mg/dL (ref 8–27)
Bilirubin Total: 1.9 mg/dL — ABNORMAL HIGH (ref 0.0–1.2)
CO2: 24 mmol/L (ref 20–29)
Calcium: 11.1 mg/dL — ABNORMAL HIGH (ref 8.6–10.2)
Chloride: 99 mmol/L (ref 96–106)
Creatinine, Ser: 1.28 mg/dL — ABNORMAL HIGH (ref 0.76–1.27)
GFR calc Af Amer: 65 mL/min/{1.73_m2} (ref >59)
GFR calc non Af Amer: 56 mL/min/{1.73_m2} — ABNORMAL LOW (ref >59)
Globulin, Total: 3.1 g/dL (ref 1.5–4.5)
Glucose: 85 mg/dL (ref 65–99)
Potassium: 3.5 mmol/L (ref 3.5–5.2)
Sodium: 141 mmol/L (ref 134–144)
Total Protein: 7.6 g/dL (ref 6.0–8.5)

## 2017-01-20 LAB — HEPATITIS C ANTIBODY: Hep C Virus Ab: <0.1 s/co ratio (ref 0.0–0.9)

## 2017-01-20 LAB — LIPID PANEL
Chol/HDL Ratio: 2 ratio (ref 0.0–5.0)
Cholesterol, Total: 188 mg/dL (ref 100–199)
HDL: 94 mg/dL (ref >39)
LDL Calculated: 78 mg/dL (ref 0–99)
Triglycerides: 80 mg/dL (ref 0–149)
VLDL Cholesterol Cal: 16 mg/dL (ref 5–40)

## 2017-01-20 LAB — CBC
Hematocrit: 46.6 % (ref 37.5–51.0)
Hemoglobin: 15.7 g/dL (ref 13.0–17.7)
MCH: 34.7 pg — ABNORMAL HIGH (ref 26.6–33.0)
MCHC: 33.7 g/dL (ref 31.5–35.7)
MCV: 103 fL — ABNORMAL HIGH (ref 79–97)
Platelets: 160 10*3/uL (ref 150–379)
RBC: 4.53 x10E6/uL (ref 4.14–5.80)
RDW: 12.8 % (ref 12.3–15.4)
WBC: 5 10*3/uL (ref 3.4–10.8)

## 2017-01-20 LAB — TSH: TSH: 0.619 u[IU]/mL (ref 0.450–4.500)

## 2017-01-20 NOTE — Progress Notes (Signed)
Case discussed with Dr. Reesa Chew at the time of the visit.  We reviewed the resident's history and exam and pertinent patient test results.  I agree with the assessment, diagnosis and plan of care documented in the resident's note with the following exception:  The patient does complain of nocturia, so he does have signs and symptoms consistent with obstruction related to BPH.  We are hopeful he begins to take his HCTZ regularly and in the AM to help with the hypertension and yet minimize the nocturia.  We will continue the tamsulosin and finasteride for the BPH.  We will make sure his LE pain on ambulation is not claudication by repeating the history at the follow-up visit.

## 2017-01-20 NOTE — Addendum Note (Signed)
Addended by: Lorella Nimrod on: 01/20/2017 04:32 PM   Modules accepted: Orders

## 2017-01-24 ENCOUNTER — Ambulatory Visit: Payer: Medicare HMO | Admitting: Podiatry

## 2017-01-24 ENCOUNTER — Encounter: Payer: Self-pay | Admitting: Podiatry

## 2017-01-24 DIAGNOSIS — B351 Tinea unguium: Secondary | ICD-10-CM | POA: Diagnosis not present

## 2017-01-24 DIAGNOSIS — M79676 Pain in unspecified toe(s): Secondary | ICD-10-CM

## 2017-01-26 ENCOUNTER — Ambulatory Visit (INDEPENDENT_AMBULATORY_CARE_PROVIDER_SITE_OTHER): Payer: Medicare HMO | Admitting: Internal Medicine

## 2017-01-26 ENCOUNTER — Other Ambulatory Visit: Payer: Self-pay

## 2017-01-26 DIAGNOSIS — Z79899 Other long term (current) drug therapy: Secondary | ICD-10-CM

## 2017-01-26 DIAGNOSIS — D7589 Other specified diseases of blood and blood-forming organs: Secondary | ICD-10-CM

## 2017-01-26 DIAGNOSIS — Z720 Tobacco use: Secondary | ICD-10-CM

## 2017-01-26 DIAGNOSIS — Z7289 Other problems related to lifestyle: Secondary | ICD-10-CM | POA: Diagnosis not present

## 2017-01-26 DIAGNOSIS — R748 Abnormal levels of other serum enzymes: Secondary | ICD-10-CM | POA: Diagnosis not present

## 2017-01-26 DIAGNOSIS — F1090 Alcohol use, unspecified, uncomplicated: Secondary | ICD-10-CM

## 2017-01-26 DIAGNOSIS — I10 Essential (primary) hypertension: Secondary | ICD-10-CM

## 2017-01-26 DIAGNOSIS — F17210 Nicotine dependence, cigarettes, uncomplicated: Secondary | ICD-10-CM

## 2017-01-26 HISTORY — DX: Tobacco use: Z72.0

## 2017-01-26 NOTE — Progress Notes (Signed)
Internal Medicine Clinic Attending  Case discussed with Dr. Wallace at the time of the visit.  We reviewed the resident's history and exam and pertinent patient test results.  I agree with the assessment, diagnosis, and plan of care documented in the resident's note.  

## 2017-01-26 NOTE — Progress Notes (Signed)
   SUBJECTIVE Patient presents to office today complaining of elongated, thickened nails. Pain while ambulating in shoes. Patient is unable to trim their own nails.   Past Medical History:  Diagnosis Date  . BPH (benign prostatic hyperplasia)   . Hypertension     OBJECTIVE General Patient is awake, alert, and oriented x 3 and in no acute distress. Derm Skin is dry and supple bilateral. Negative open lesions or macerations. Remaining integument unremarkable. Nails are tender, long, thickened and dystrophic with subungual debris, consistent with onychomycosis, 1-5 bilateral. No signs of infection noted. Vasc  DP and PT pedal pulses palpable bilaterally. Temperature gradient within normal limits.  Neuro Epicritic and protective threshold sensation diminished bilaterally.  Musculoskeletal Exam No symptomatic pedal deformities noted bilateral. Muscular strength within normal limits.  ASSESSMENT 1. Onychodystrophic nails 1-5 bilateral with hyperkeratosis of nails.  2. Onychomycosis of nail due to dermatophyte bilateral 3. Pain in foot bilateral  PLAN OF CARE 1. Patient evaluated today.  2. Instructed to maintain good pedal hygiene and foot care.  3. Mechanical debridement of nails 1-5 bilaterally performed using a nail nipper. Filed with dremel without incident.  4. Return to clinic in 3 mos.    Edrick Kins, DPM [facility]  Dr. Edrick Kins, [address]                                        [place], [place] [address]                Office [phone]  Fax [phone]

## 2017-01-26 NOTE — Progress Notes (Signed)
   CC: here for BP follow up check  HPI:  Bryan Vincent is a 71 y.o. man with medical history as below here for BP follow up check.  Seen in clinic on 01/19/2017.  Had his Exforge and HCTZ refilled.  CMET revealed elevated calcium so he was asked to stop his diuretic.  Today, he presents for follow up of his HTN. He continues taking Exforge without complications, but he also continues to take his HCTZ.  He brought a home BP log with Systolic readings ranging from 107-145   Denies symptoms of chest pain, lightheadedness, chest pain.  Past Medical History:  Diagnosis Date  . BPH (benign prostatic hyperplasia)   . Hypertension    Review of Systems:  Please see pertinent ROS reviewed in HPI and problem based charting.   Physical Exam:  Vitals:   01/26/17 1406  BP: 108/64  Pulse: 96  Temp: 97.9 F (36.6 C)  TempSrc: Oral  SpO2: 100%  Weight: 170 lb 14.4 oz (77.5 kg)   Physical Exam  Constitutional: He is oriented to person, place, and time and well-developed, well-nourished, and in no distress.  HENT:  Head: Normocephalic and atraumatic.  Cardiovascular: Normal rate and regular rhythm.  Pulmonary/Chest: Effort normal.  Musculoskeletal: Normal range of motion.  Neurological: He is alert and oriented to person, place, and time.  Skin: Skin is warm and dry.     Assessment & Plan:   See Encounters Tab for problem based charting.  Patient discussed with Dr. Angelia Mould.  Essential hypertension BP: 108/64   Assessment: BP today is improved from one week ago.  Patient has continued to take his amlodipine-valsartan combination as well as the HCTZ that was discontinued after BMET revealed hypercalcemia.  He has also been taking his potassium pills.  No symptoms today of hypotension.    Plan: - Discontinue HCTZ and potassium pills.  Will not repeat BMET today in light of his continued HCTZ use - Continue amlodipine-valsartan combination - RTC next available with his PCP to  go over lab work and repeat BMET off his HCTZ - Reassess BP at that visit as he will have been off HCTZ at that time.  Tobacco abuse Assessment: Patient reports 30+ year history of at least 1/2 PPD smoking.  He is interested in quitting.  When asked regarding what resources he would be interested in, he declined resources today and wants to do so on his own.  Plan: - Continue to address at follow up.

## 2017-01-26 NOTE — Assessment & Plan Note (Addendum)
BP: 108/64   Assessment: BP today is improved from one week ago.  Patient has continued to take his amlodipine-valsartan combination as well as the HCTZ that was discontinued after BMET revealed hypercalcemia.  He has also been taking his potassium pills.  No symptoms today of hypotension.    Plan: - Discontinue HCTZ and potassium pills.  Will not repeat BMET today in light of his continued HCTZ use - Continue amlodipine-valsartan combination - RTC next available with his PCP to go over lab work and repeat BMET off his HCTZ - Reassess BP at that visit as he will have been off HCTZ at that time.

## 2017-01-26 NOTE — Patient Instructions (Signed)
FOLLOW-UP INSTRUCTIONS When: next available with Dr Trilby Drummer For: follow up of labwork and blood pressure What to bring: medications and home BP log  We have stopped your hydrochlorathiazide and potassium pills  Keep taking your other blood pressure medication  Stop by the lab on your way out of clinic.

## 2017-01-26 NOTE — Assessment & Plan Note (Signed)
Assessment: Patient reports 30+ year history of at least 1/2 PPD smoking.  He is interested in quitting.  When asked regarding what resources he would be interested in, he declined resources today and wants to do so on his own.  Plan: - Continue to address at follow up.

## 2017-01-27 DIAGNOSIS — E210 Primary hyperparathyroidism: Secondary | ICD-10-CM | POA: Insufficient documentation

## 2017-01-27 DIAGNOSIS — D7589 Other specified diseases of blood and blood-forming organs: Secondary | ICD-10-CM | POA: Insufficient documentation

## 2017-01-27 HISTORY — DX: Primary hyperparathyroidism: E21.0

## 2017-01-27 HISTORY — DX: Other specified diseases of blood and blood-forming organs: D75.89

## 2017-01-27 LAB — RETICULOCYTES: Retic Ct Pct: 1.5 % (ref 0.6–2.6)

## 2017-01-27 LAB — PTH, INTACT AND CALCIUM
Calcium: 11.6 mg/dL — ABNORMAL HIGH (ref 8.6–10.2)
PTH: 67 pg/mL — ABNORMAL HIGH (ref 15–65)

## 2017-01-27 LAB — HAPTOGLOBIN: Haptoglobin: 125 mg/dL (ref 34–200)

## 2017-01-27 LAB — FOLATE: Folate: 9.2 ng/mL (ref >3.0)

## 2017-01-27 LAB — FERRITIN: Ferritin: 646 ng/mL — ABNORMAL HIGH (ref 30–400)

## 2017-01-27 LAB — IRON: Iron: 127 ug/dL (ref 38–169)

## 2017-01-27 LAB — VITAMIN B12: Vitamin B-12: 319 pg/mL (ref 232–1245)

## 2017-01-27 LAB — GAMMA GT: GGT: 52 IU/L (ref 0–65)

## 2017-01-27 LAB — LACTATE DEHYDROGENASE: LDH: 144 IU/L (ref 121–224)

## 2017-01-27 LAB — HIV ANTIBODY (ROUTINE TESTING W REFLEX): HIV Screen 4th Generation wRfx: NONREACTIVE

## 2017-01-27 LAB — VITAMIN D 25 HYDROXY (VIT D DEFICIENCY, FRACTURES): Vit D, 25-Hydroxy: 11.7 ng/mL — ABNORMAL LOW (ref 30.0–100.0)

## 2017-01-27 MED ORDER — VITAMIN D (ERGOCALCIFEROL) 1.25 MG (50000 UNIT) PO CAPS: 50000.0000 [IU] | ORAL_CAPSULE | ORAL | 0 refills | Status: DC

## 2017-01-27 NOTE — Addendum Note (Signed)
Addended by: Lorella Nimrod on: 01/27/2017 08:08 PM   Modules accepted: Orders

## 2017-01-27 NOTE — Assessment & Plan Note (Signed)
Addendum.  Patient continued to have hypercalcemia with mildly elevated parathyroid levels.  Patient also has vitamin D deficiency and continue to take hydrochlorothiazide despite told him to discontinue.  -Prescription for vitamin D 50,000 units/week for 8 weeks was sent to his pharmacy, he should follow-up after that.  He will also need vitamin D 2000 units daily afterwards. -We will repeat his calcium, parathyroid and vitamin D level in 67-month.  If continued to remain elevated he will need further investigation for primary hyperparathyroidism.

## 2017-01-27 NOTE — Assessment & Plan Note (Signed)
His ferritin is elevated at 646, with normal iron, folate, B12, LDH and haptoglobin level.  Call to make an appointment with PCP to discuss results and for further investigation.

## 2017-02-05 ENCOUNTER — Other Ambulatory Visit: Payer: Self-pay | Admitting: Nurse Practitioner

## 2017-03-16 NOTE — Telephone Encounter (Signed)
Visit complete.

## 2017-03-18 ENCOUNTER — Other Ambulatory Visit: Payer: Self-pay | Admitting: *Deleted

## 2017-03-18 MED ORDER — VITAMIN D (ERGOCALCIFEROL) 1.25 MG (50000 UNIT) PO CAPS: 50000.0000 [IU] | ORAL_CAPSULE | ORAL | 0 refills | Status: DC

## 2017-03-22 ENCOUNTER — Other Ambulatory Visit: Payer: Self-pay | Admitting: Internal Medicine

## 2017-04-14 ENCOUNTER — Ambulatory Visit (INDEPENDENT_AMBULATORY_CARE_PROVIDER_SITE_OTHER): Payer: Medicare HMO | Admitting: Internal Medicine

## 2017-04-14 ENCOUNTER — Encounter (INDEPENDENT_AMBULATORY_CARE_PROVIDER_SITE_OTHER): Payer: Self-pay

## 2017-04-14 VITALS — BP 151/88 | HR 106 | Temp 97.5°F | Ht 70.0 in | Wt 173.2 lb

## 2017-04-14 DIAGNOSIS — I10 Essential (primary) hypertension: Secondary | ICD-10-CM | POA: Diagnosis not present

## 2017-04-14 DIAGNOSIS — H538 Other visual disturbances: Secondary | ICD-10-CM | POA: Diagnosis not present

## 2017-04-14 DIAGNOSIS — E210 Primary hyperparathyroidism: Secondary | ICD-10-CM | POA: Diagnosis not present

## 2017-04-14 DIAGNOSIS — Z79899 Other long term (current) drug therapy: Secondary | ICD-10-CM

## 2017-04-14 DIAGNOSIS — R238 Other skin changes: Secondary | ICD-10-CM

## 2017-04-14 DIAGNOSIS — Z9889 Other specified postprocedural states: Secondary | ICD-10-CM | POA: Diagnosis not present

## 2017-04-14 HISTORY — DX: Other visual disturbances: H53.8

## 2017-04-14 MED ORDER — CARVEDILOL 6.25 MG PO TABS: 6.2500 mg | ORAL_TABLET | Freq: Two times a day (BID) | ORAL | 2 refills | Status: DC
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
## 2017-04-14 NOTE — Patient Instructions (Addendum)
Thank you for allowing [place] to care for you  For your high blood pressure: - Elevated today at 151/88 - Continue to take you current combination pill daily - We have added Carvedilol 6.25mg  Twice a day - Please buy a blood pressure cuff so you can keep track at home; and bring this cuff to your next visit - We are getting lab work today to monitor your renal function  For you high Calcium - We are getting lab work to check your calcium level, vitamin D level, and parathyroid hormone level - Please Stop taking vitamin D supplement  For you vision changes; - We have referred you to an eye doctor  Please follow up in the next 1-2 months

## 2017-04-15 ENCOUNTER — Encounter: Payer: Self-pay | Admitting: Internal Medicine

## 2017-04-15 LAB — VITAMIN D 25 HYDROXY (VIT D DEFICIENCY, FRACTURES): VIT D 25 HYDROXY: 55.9 ng/mL (ref 30.0–100.0)

## 2017-04-15 LAB — BMP8+ANION GAP
Anion Gap: 15 mmol/L (ref 10.0–18.0)
BUN / CREAT RATIO: 7 — AB (ref 10–24)
BUN: 7 mg/dL — AB (ref 8–27)
CALCIUM: 11 mg/dL — AB (ref 8.6–10.2)
CHLORIDE: 104 mmol/L (ref 96–106)
CO2: 22 mmol/L (ref 20–29)
CREATININE: 1.03 mg/dL (ref 0.76–1.27)
GFR calc non Af Amer: 73 mL/min/{1.73_m2} (ref >59)
GFR, EST AFRICAN AMERICAN: 85 mL/min/{1.73_m2} (ref >59)
GLUCOSE: 92 mg/dL (ref 65–99)
Potassium: 3 mmol/L — ABNORMAL LOW (ref 3.5–5.2)
Sodium: 141 mmol/L (ref 134–144)

## 2017-04-15 LAB — PTH, INTACT AND CALCIUM
CALCIUM: 11.1 mg/dL — AB (ref 8.6–10.2)
PTH: 108 pg/mL — ABNORMAL HIGH (ref 15–65)

## 2017-04-15 NOTE — Assessment & Plan Note (Signed)
Patient and his wife endorse that he has had vision change over the past 1-1.5 years. He has never been seen for these issues in the past. He complain of blurry vision in the right eye. On exam, he is noted to have decreased visual acuity in his left upper and lower visual field in the right eye AND decreased acuity of the right upper and lower visual fields of the left eye. He denies any rapid changes in vision, headaches, lightheadedness, dizziness , new hearing changes, or other new weakness/numbeness. Patient will be given a referral to ophthalmology. Patient and wife are requesting [facility] as [facility] is where his wife goes. - Referral to opthalmology

## 2017-04-15 NOTE — Assessment & Plan Note (Addendum)
BP 151/88 today. Last visit BP 108/64; at which time the patient discontinued HCTZ due to hypercalcemia. He additionally stopped his KCl at that time. With blood pressure elevated and initial HR 106 (improved to 84 on exam) will add a beta blocker to his amlodipine-valsartan combination pill, which is at it's max dose. We will also be rechecking BMET today now that he has been off HCTZ. - Amlodipine-Valsartan 10-320mg  Daily - Carvedilol 6.25 BID - BMET today - F/U in 1-2 months to evaluate effective ness of Carvedilol  ADDENDUM: BMP showed K of 3.0, will need repeat BMP at HTN follow up. Not on any K wasting medications. Is on losartan, which should help keep potassium up.

## 2017-04-15 NOTE — Progress Notes (Signed)
Internal Medicine Clinic Attending  Case discussed with Dr. Trilby Drummer  at the time of the visit.  We reviewed the resident's history and exam and pertinent patient test results.  I agree with the assessment, diagnosis, and plan of care documented in the resident's note.  Primary hyperparathyroidism in an elderly gentleman confirmed today. We will have to start the process of deciding whether monitoring or surgery will be our treatment course.

## 2017-04-15 NOTE — Assessment & Plan Note (Addendum)
Patient has been noted to have hypercalcemia on recent labs. He was given a prescription for Vitamin D as he was also noted to have low Vitamin D. His lab work showed Ca 11.6; PTH 67 (mildly elevated); and Vitamin D 11.7 low. With elevated calcium and elevated PTH, Patient his met criteria for primary hyperparathyroidism. Patient denies any symptoms (no bone pain, no history of renal stones, no constipation or nausea). We will repeat BMET today as previous lab was confounded by HCTZ use and Ca level >1 over the upper limit of normal can be an indication for surgery. So we will see we his Ca is off of HCTZ. Will also recheck PTH and Vit D. - BMET, PTH, Vit D  ADDENDUM: BMET shows Ca 11 (no longer >1 over upper limit of normal), eGFR 85; PTH elevated 108 ; Vit D 55.6. - Will continue to monitor asymptomatic primary hyperparathyroidism - Will initiate 2,053m daily Vit D - Will need to refer patient for DEXA scan as  T-score < -2.5 is an indication for surgery in asymptomatic PHPT.  ADDENDUM: DEXA performed and T-score -1.1 indicating osteopenia, but not osteoporosis and not an indication for surgery. Patient will be notified with results.

## 2017-04-16 ENCOUNTER — Other Ambulatory Visit: Payer: Self-pay | Admitting: Internal Medicine

## 2017-04-17 ENCOUNTER — Other Ambulatory Visit: Payer: Self-pay | Admitting: Internal Medicine

## 2017-04-18 ENCOUNTER — Telehealth: Payer: Self-pay | Admitting: Internal Medicine

## 2017-04-18 DIAGNOSIS — E210 Primary hyperparathyroidism: Secondary | ICD-10-CM

## 2017-04-18 NOTE — Telephone Encounter (Addendum)
Patient contacted regarding his recent lab results. He was informed that his potassium remains elevated but has improved somewhat. He was also told the his PTH level was further elevated. He was told that as a part of his work up for his primary hyperparathyroidism we would be ordering a DEXA study. He was also informed that a prescription for lower dose daily Vitamin D was being ordered for him.  Pearson Grippe, DO IM PGY-1

## 2017-04-20 MED ORDER — VITAMIN D 50 MCG (2000 UT) PO TABS: 2000.0000 [IU] | ORAL_TABLET | Freq: Every day | ORAL | 5 refills | Status: DC

## 2017-04-25 ENCOUNTER — Ambulatory Visit: Payer: Medicare HMO | Admitting: Podiatry

## 2017-05-03 ENCOUNTER — Ambulatory Visit: Payer: Medicare HMO | Admitting: Podiatry

## 2017-05-05 ENCOUNTER — Other Ambulatory Visit: Payer: Self-pay | Admitting: *Deleted

## 2017-05-05 MED ORDER — AMLODIPINE BESYLATE-VALSARTAN 10-320 MG PO TABS: 1.0000 | ORAL_TABLET | Freq: Every day | ORAL | 1 refills | Status: DC

## 2017-05-12 ENCOUNTER — Encounter: Payer: Self-pay | Admitting: Podiatry

## 2017-05-12 ENCOUNTER — Ambulatory Visit: Payer: Medicare HMO | Admitting: Podiatry

## 2017-05-12 DIAGNOSIS — B351 Tinea unguium: Secondary | ICD-10-CM | POA: Diagnosis not present

## 2017-05-12 DIAGNOSIS — M79676 Pain in unspecified toe(s): Secondary | ICD-10-CM

## 2017-05-13 NOTE — Progress Notes (Signed)
Subjective: 72 y.o. returns the office today for painful, elongated, thickened toenails which they cannot trim themself. Denies any redness or drainage around the nails.  He states the left great toenail was trimmed 2 months last appointment has been sore he does not with a left big toenail trimmed.  He has no redness or drainage coming from the toenail.  Denies any acute changes since last appointment and no new complaints today. Denies any systemic complaints such as fevers, chills, nausea, vomiting.   PCP: [name], MD   Objective: AAO 3, NAD DP/PT pulses palpable, CRT less than 3 seconds Protective sensation decreased with Simms Weinstein monofilament Nails hypertrophic, dystrophic, elongated, brittle, discolored 10. There is tenderness overlying the nails 1-5 bilaterally. There is no surrounding erythema or drainage along the nail sites. No open lesions or pre-ulcerative lesions are identified. No other areas of tenderness bilateral lower extremities. No overlying edema, erythema, increased warmth. No pain with calf compression, swelling, warmth, erythema.  Assessment: Patient presents with symptomatic onychomycosis  Plan: -Treatment options including alternatives, risks, complications were discussed -Nails sharply debrided 9 without complication/bleeding.  I did not trim the left hallux toenail his request.  We did try to smooth the nail down some for him.  There is no signs of infection of the toenail.  Recommend to monitor closely. -Discussed daily foot inspection. If there are any changes, to call the office immediately.  -Follow-up in 3 months or sooner if any problems are to arise. In the meantime, encouraged to call the office with any questions, concerns, changes symptoms.  Celesta Gentile, DPM

## 2017-05-31 DIAGNOSIS — H40013 Open angle with borderline findings, low risk, bilateral: Secondary | ICD-10-CM | POA: Diagnosis not present

## 2017-05-31 DIAGNOSIS — H2513 Age-related nuclear cataract, bilateral: Secondary | ICD-10-CM | POA: Diagnosis not present

## 2017-06-06 ENCOUNTER — Other Ambulatory Visit: Payer: Self-pay | Admitting: Ophthalmology

## 2017-06-07 ENCOUNTER — Other Ambulatory Visit: Payer: Self-pay | Admitting: Ophthalmology

## 2017-06-07 DIAGNOSIS — H4720 Unspecified optic atrophy: Secondary | ICD-10-CM

## 2017-06-14 ENCOUNTER — Ambulatory Visit
Admission: RE | Admit: 2017-06-14 | Discharge: 2017-06-14 | Disposition: A | Discharge status: Home / Self Care | Payer: Medicare HMO | Source: Ambulatory Visit | Attending: Ophthalmology | Admitting: Ophthalmology

## 2017-06-14 DIAGNOSIS — J019 Acute sinusitis, unspecified: Secondary | ICD-10-CM | POA: Diagnosis not present

## 2017-06-14 DIAGNOSIS — H4720 Unspecified optic atrophy: Secondary | ICD-10-CM

## 2017-06-14 MED ORDER — GADOBENATE DIMEGLUMINE 529 MG/ML IV SOLN
16.0000 mL | Freq: Once | INTRAVENOUS | Status: AC | PRN
  Administered 2017-06-14: 16 mL via INTRAVENOUS

## 2017-06-20 DIAGNOSIS — E638 Other specified nutritional deficiencies: Secondary | ICD-10-CM | POA: Diagnosis not present

## 2017-06-20 DIAGNOSIS — H538 Other visual disturbances: Secondary | ICD-10-CM | POA: Diagnosis not present

## 2017-06-20 DIAGNOSIS — H47293 Other optic atrophy, bilateral: Secondary | ICD-10-CM | POA: Diagnosis not present

## 2017-06-29 ENCOUNTER — Ambulatory Visit
Admission: RE | Admit: 2017-06-29 | Discharge: 2017-06-29 | Disposition: A | Discharge status: Home / Self Care | Payer: Medicare HMO | Source: Ambulatory Visit | Attending: Student in an Organized Health Care Education/Training Program | Admitting: Student in an Organized Health Care Education/Training Program

## 2017-06-29 DIAGNOSIS — M85851 Other specified disorders of bone density and structure, right thigh: Secondary | ICD-10-CM | POA: Diagnosis not present

## 2017-06-29 DIAGNOSIS — E210 Primary hyperparathyroidism: Secondary | ICD-10-CM

## 2017-07-01 DIAGNOSIS — H40013 Open angle with borderline findings, low risk, bilateral: Secondary | ICD-10-CM | POA: Diagnosis not present

## 2017-07-01 DIAGNOSIS — H2513 Age-related nuclear cataract, bilateral: Secondary | ICD-10-CM | POA: Diagnosis not present

## 2017-07-06 ENCOUNTER — Other Ambulatory Visit: Payer: Self-pay | Admitting: Internal Medicine

## 2017-07-06 DIAGNOSIS — I10 Essential (primary) hypertension: Secondary | ICD-10-CM

## 2017-07-07 NOTE — Telephone Encounter (Signed)
Refill approved.

## 2017-08-03 ENCOUNTER — Telehealth: Payer: Self-pay | Admitting: Internal Medicine

## 2017-08-03 NOTE — Telephone Encounter (Signed)
Patient did not remember conversation with PCP from 04/18/2017. Notes from that conversation read to patient. Also made aware of results of Dexa Scan showing osteopenia. Patient taking Vit D daily. First available appt made with PCP for 09/29/2017. Hubbard Hartshorn, RN, BSN

## 2017-08-03 NOTE — Telephone Encounter (Signed)
Patient is calling about results from labs 04/2017. pls call patient

## 2017-08-15 ENCOUNTER — Ambulatory Visit: Payer: Medicare HMO | Admitting: Podiatry

## 2017-08-25 ENCOUNTER — Other Ambulatory Visit: Payer: Self-pay | Admitting: *Deleted

## 2017-08-25 DIAGNOSIS — I10 Essential (primary) hypertension: Secondary | ICD-10-CM

## 2017-08-25 MED ORDER — CARVEDILOL 6.25 MG PO TABS: ORAL_TABLET | ORAL | 1 refills | Status: DC

## 2017-08-25 MED ORDER — AMLODIPINE BESYLATE-VALSARTAN 10-320 MG PO TABS: 1.0000 | ORAL_TABLET | Freq: Every day | ORAL | 1 refills | Status: DC

## 2017-08-25 MED ORDER — FINASTERIDE 5 MG PO TABS: 5.0000 mg | ORAL_TABLET | Freq: Every day | ORAL | 1 refills | Status: DC

## 2017-08-25 NOTE — Telephone Encounter (Signed)
Changing to [place] per pt's request.

## 2017-09-29 ENCOUNTER — Encounter: Payer: Medicare HMO | Admitting: Internal Medicine

## 2017-09-30 DIAGNOSIS — H40013 Open angle with borderline findings, low risk, bilateral: Secondary | ICD-10-CM | POA: Diagnosis not present

## 2017-09-30 DIAGNOSIS — H2513 Age-related nuclear cataract, bilateral: Secondary | ICD-10-CM | POA: Diagnosis not present

## 2017-10-05 ENCOUNTER — Other Ambulatory Visit: Payer: Self-pay | Admitting: Internal Medicine

## 2017-10-05 DIAGNOSIS — I10 Essential (primary) hypertension: Secondary | ICD-10-CM

## 2017-10-05 DIAGNOSIS — E210 Primary hyperparathyroidism: Secondary | ICD-10-CM

## 2017-10-05 NOTE — Telephone Encounter (Signed)
Refills approved. Carvedilol moved to [facility]

## 2017-10-15 ENCOUNTER — Other Ambulatory Visit: Payer: Self-pay | Admitting: Internal Medicine

## 2017-10-18 NOTE — Telephone Encounter (Signed)
#   90 with 1 refill sent to [facility] 08/25/2017. Confirmed with patient that he uses [facility]. Will refuse [facility] request. Hubbard Hartshorn, RN, BSN

## 2017-11-03 ENCOUNTER — Ambulatory Visit (INDEPENDENT_AMBULATORY_CARE_PROVIDER_SITE_OTHER): Payer: Medicare HMO | Admitting: Internal Medicine

## 2017-11-03 ENCOUNTER — Encounter: Payer: Self-pay | Admitting: Internal Medicine

## 2017-11-03 ENCOUNTER — Other Ambulatory Visit: Payer: Self-pay

## 2017-11-03 ENCOUNTER — Encounter (INDEPENDENT_AMBULATORY_CARE_PROVIDER_SITE_OTHER): Payer: Self-pay

## 2017-11-03 VITALS — BP 119/67 | HR 66 | Temp 97.7°F | Ht 71.0 in | Wt 163.2 lb

## 2017-11-03 DIAGNOSIS — E876 Hypokalemia: Secondary | ICD-10-CM

## 2017-11-03 DIAGNOSIS — E210 Primary hyperparathyroidism: Secondary | ICD-10-CM | POA: Diagnosis not present

## 2017-11-03 DIAGNOSIS — Z23 Encounter for immunization: Secondary | ICD-10-CM

## 2017-11-03 DIAGNOSIS — H538 Other visual disturbances: Secondary | ICD-10-CM | POA: Diagnosis not present

## 2017-11-03 DIAGNOSIS — R3914 Feeling of incomplete bladder emptying: Secondary | ICD-10-CM | POA: Diagnosis not present

## 2017-11-03 DIAGNOSIS — I10 Essential (primary) hypertension: Secondary | ICD-10-CM | POA: Diagnosis not present

## 2017-11-03 DIAGNOSIS — Z79899 Other long term (current) drug therapy: Secondary | ICD-10-CM

## 2017-11-03 DIAGNOSIS — N401 Enlarged prostate with lower urinary tract symptoms: Secondary | ICD-10-CM

## 2017-11-03 DIAGNOSIS — R350 Frequency of micturition: Secondary | ICD-10-CM | POA: Diagnosis not present

## 2017-11-03 HISTORY — DX: Hypokalemia: E87.6

## 2017-11-03 MED ORDER — TAMSULOSIN HCL 0.4 MG PO CAPS: 0.4000 mg | ORAL_CAPSULE | Freq: Every day | ORAL | 2 refills | Status: DC

## 2017-11-03 NOTE — Assessment & Plan Note (Addendum)
BP 119/67 today. Last visit BP was 151/88. This was following discontinuation of HCTZ 2/2 hypercalcemia. Coreg was added at last visit given elevated BP and high-normal heart rate. BMP showed K had dropped to 3.0 and patient had stopped KCl supplement in the past along with his HCTZ. Will need repeat BMP today as he had not followed up until now - Amlodipine-Valsartan 10-320mg  Daily - Carvedilol 6.25 BID - BMET today

## 2017-11-03 NOTE — Progress Notes (Signed)
   CC: Hypertension, Hypokalemia, BPH, Vision blurred, Primary hyperparathyroidism, Preventative healthcare  HPI:  [name] is a 72 y.o. M with PMHx listed below presenting for Hypertension, Hypokalemia, BPH, Vision blurred, Primary hyperparathyroidism, Preventative healthcare. Please see the A&P for the status of the patient's chronic medical problems.   Past Medical History:  Diagnosis Date  . BPH (benign prostatic hyperplasia)   . Hypertension    Review of Systems:  Performed and all others negative.  Physical Exam:  Vitals:   11/03/17 1343  BP: 119/67  Pulse: 66  Temp: 97.7 F (36.5 C)  TempSrc: Oral  SpO2: 98%  Weight: 163 lb 3.2 oz (74 kg)  Height: 5\' 11"  (1.803 m)   Physical Exam  Constitutional: He appears well-developed and well-nourished. No distress.  Cardiovascular: Normal rate, regular rhythm, normal heart sounds and intact distal pulses.  Pulmonary/Chest: Effort normal and breath sounds normal. No respiratory distress.  Abdominal: Soft. Bowel sounds are normal. He exhibits no distension. There is no tenderness.  Musculoskeletal: He exhibits no edema or deformity.  Skin: Skin is warm and dry.    Assessment & Plan:   See Encounters Tab for problem based charting.  Patient discussed with Dr. Beryle Beams

## 2017-11-03 NOTE — Assessment & Plan Note (Signed)
Patient has a history of BPH with symptoms that had been controlled on finasteride and Tamsulosin. He ran out of Tamsulosin about 6 moths ago and has noticed about 1 month of increased urinary frequency hesitancy and sensation of incomplete voiding. Will resume Tamsulosin and recheck symptoms at follow up. - Finasteride 5mg  Daily - Resume Tamsulosin 0.4mg  Dialy

## 2017-11-03 NOTE — Assessment & Plan Note (Signed)
Hypokalemia noted on labs last visit. He had stopped HCTZ and continue on ARB combination. Will recheck to ensure K has normalized. - BMET

## 2017-11-03 NOTE — Assessment & Plan Note (Addendum)
Patient continues to deny bone pain, renal stones, constipation, or nausea. We will be getting BMET today for hypokalemia, will reassess renal function and Ca as well. - Continue to monitor - BMET

## 2017-11-03 NOTE — Assessment & Plan Note (Signed)
Patient has been seen several time by [facility] since his referral and is currently prescribed eye drops. States vision is improving. Will need to obtain records. - F/u with opthalmology

## 2017-11-03 NOTE — Patient Instructions (Addendum)
Thank you for allowing [place] to care for you  Your blood pressure is well controlled on current medications - Continue current medications  Enlarged prostate - We have resumed your previously prescribed medications - Follow up in about 3 months to ensure symptoms have improved  Low potassium - This was low at your last visit - You had lab work done today, you will be contacted with the results  You received you flu and pneumonia vaccines today  Please follow up in about 3 months

## 2017-11-04 LAB — BMP8+ANION GAP
Anion Gap: 13 mmol/L (ref 10.0–18.0)
BUN / CREAT RATIO: 8 — AB (ref 10–24)
BUN: 9 mg/dL (ref 8–27)
CHLORIDE: 106 mmol/L (ref 96–106)
CO2: 24 mmol/L (ref 20–29)
Calcium: 10.6 mg/dL — ABNORMAL HIGH (ref 8.6–10.2)
Creatinine, Ser: 1.19 mg/dL (ref 0.76–1.27)
GFR calc non Af Amer: 61 mL/min/{1.73_m2} (ref >59)
GFR, EST AFRICAN AMERICAN: 71 mL/min/{1.73_m2} (ref >59)
GLUCOSE: 86 mg/dL (ref 65–99)
POTASSIUM: 3.3 mmol/L — AB (ref 3.5–5.2)
SODIUM: 143 mmol/L (ref 134–144)

## 2017-11-04 NOTE — Progress Notes (Signed)
Medicine attending: Medical history, presenting problems, physical findings, and medications, reviewed with resident physician Dr Alexander Melvin on the day of the patient visit and I concur with his evaluation and management plan. 

## 2017-11-14 ENCOUNTER — Telehealth: Payer: Self-pay | Admitting: Internal Medicine

## 2017-11-14 ENCOUNTER — Other Ambulatory Visit: Payer: Self-pay | Admitting: Internal Medicine

## 2017-11-14 NOTE — Telephone Encounter (Signed)
Refused as refill was provided in July, which should last through next January.

## 2017-11-14 NOTE — Telephone Encounter (Signed)
Patient called to inform him of recent lab results. He was informed that his kidney function was normal on recent BMP and his potassium, while mildly low at 3.3, has improved from previous of 3.0. Ecouraged consumption of fruit to increase potassium intake (including melons, oranges, and pears).  Bryan Grippe, DO IM PGY-2

## 2017-12-15 ENCOUNTER — Telehealth: Payer: Self-pay | Admitting: Internal Medicine

## 2018-01-17 DIAGNOSIS — H40013 Open angle with borderline findings, low risk, bilateral: Secondary | ICD-10-CM | POA: Diagnosis not present

## 2018-01-17 DIAGNOSIS — H2513 Age-related nuclear cataract, bilateral: Secondary | ICD-10-CM | POA: Diagnosis not present

## 2018-01-29 ENCOUNTER — Encounter: Payer: Self-pay | Admitting: Gastroenterology

## 2018-01-30 ENCOUNTER — Other Ambulatory Visit: Payer: Self-pay | Admitting: Internal Medicine

## 2018-01-30 NOTE — Telephone Encounter (Signed)
Refills approved.

## 2018-02-09 ENCOUNTER — Ambulatory Visit (INDEPENDENT_AMBULATORY_CARE_PROVIDER_SITE_OTHER): Payer: Medicare HMO | Admitting: Internal Medicine

## 2018-02-09 ENCOUNTER — Encounter: Payer: Self-pay | Admitting: Internal Medicine

## 2018-02-09 ENCOUNTER — Encounter (INDEPENDENT_AMBULATORY_CARE_PROVIDER_SITE_OTHER): Payer: Self-pay

## 2018-02-09 ENCOUNTER — Other Ambulatory Visit: Payer: Self-pay

## 2018-02-09 VITALS — BP 146/72 | HR 65 | Temp 97.5°F | Ht 71.0 in | Wt 167.5 lb

## 2018-02-09 DIAGNOSIS — E210 Primary hyperparathyroidism: Secondary | ICD-10-CM

## 2018-02-09 DIAGNOSIS — N400 Enlarged prostate without lower urinary tract symptoms: Secondary | ICD-10-CM

## 2018-02-09 DIAGNOSIS — I10 Essential (primary) hypertension: Secondary | ICD-10-CM | POA: Diagnosis not present

## 2018-02-09 DIAGNOSIS — M79604 Pain in right leg: Secondary | ICD-10-CM

## 2018-02-09 DIAGNOSIS — Z23 Encounter for immunization: Secondary | ICD-10-CM

## 2018-02-09 DIAGNOSIS — I739 Peripheral vascular disease, unspecified: Secondary | ICD-10-CM | POA: Insufficient documentation

## 2018-02-09 DIAGNOSIS — E876 Hypokalemia: Secondary | ICD-10-CM

## 2018-02-09 DIAGNOSIS — Z79899 Other long term (current) drug therapy: Secondary | ICD-10-CM

## 2018-02-09 DIAGNOSIS — Z1211 Encounter for screening for malignant neoplasm of colon: Secondary | ICD-10-CM

## 2018-02-09 DIAGNOSIS — R252 Cramp and spasm: Secondary | ICD-10-CM

## 2018-02-09 DIAGNOSIS — Z0000 Encounter for general adult medical examination without abnormal findings: Secondary | ICD-10-CM | POA: Insufficient documentation

## 2018-02-09 HISTORY — DX: Cramp and spasm: R25.2

## 2018-02-09 HISTORY — DX: Pain in right leg: M79.604

## 2018-02-09 MED ORDER — AMLODIPINE BESYLATE 10 MG PO TABS: 10.0000 mg | ORAL_TABLET | Freq: Every day | ORAL | 11 refills | Status: DC

## 2018-02-09 MED ORDER — VALSARTAN 160 MG PO TABS: 320.0000 mg | ORAL_TABLET | Freq: Every day | ORAL | 3 refills | Status: DC

## 2018-02-09 NOTE — Assessment & Plan Note (Signed)
Patient describes intermittent cramping of his wrists without inciting event. No trama or new activities. Exam benign. No change in strength or sensation. Symptom occur 1-2 times per week. With his history of low K will recheck BMP and continue to monitor - BMP - Continue to monitor

## 2018-02-09 NOTE — Assessment & Plan Note (Addendum)
Patient describes a history of intermittent RLE pain. The pain occurs with fast walking and is relieved by walking slower or resting. There are no other triggers for the pain. Description concerning for intermittent claudication. POC ABIs show borderline to Abnormal results bilaterally (L 0.90, R 0.92). Considering his current symptoms will refer patient to Vascular surgery for further evaluation. - Vascular surgery Referral

## 2018-02-09 NOTE — Assessment & Plan Note (Addendum)
History of BPH. Worsened Symptoms (frequency and hesitancy) for 6 months on 9/26 after being out of Tamsulosin for 6 months. He was restarted on Tamsulosin at that time. Since that visit his symptoms have improve significantly. Will continue with current medications and monitor for changes. - Finasteride 5mg  Daily - Resume Tamsulosin 0.4mg  Dialy

## 2018-02-09 NOTE — Assessment & Plan Note (Signed)
-   TDAP today - Referral for Colonoscopy (over due)

## 2018-02-09 NOTE — Assessment & Plan Note (Addendum)
BP 146/72 today. Last visit BP was 119/67. At last visit BMP showed K improved to 3.3 from 3.0 (after discontinuation of HCTZ for elevated Ca). Will continue current regimen as this is his first elevated reading. His combination pill will need to be divided into two separate pills as he states his pharmacy told him they will be out of the combo soon. - Amlodipine 10mg  Daily - Valsartan 320mg  Daily - Carvedilol 6.25 BID - Recheck at follow up

## 2018-02-09 NOTE — Assessment & Plan Note (Signed)
K improving at last visit from 3.0 to 3.3. With intermittent wrist cramps and this history, will recheck today. - BMP

## 2018-02-09 NOTE — Progress Notes (Signed)
   CC: HTN, BPH, RLE Pain, Limb Cramp, Hypokalemia, Preventative healthcare  HPI:   [name] is a 73 y.o. M with PMHx listed below presenting for HTN, BPH, RLE Pain, Limb Cramp, Hypokalemia, Preventative healthcare. Please see the A&P for the status of the patient's chronic medical problems.    Past Medical History:  Diagnosis Date  . BPH (benign prostatic hyperplasia)   . Hypertension    Review of Systems: Performed and all others negative.  Physical Exam:  Vitals:   02/09/18 1321  BP: (!) 146/72  Pulse: 65  Temp: (!) 97.5 F (36.4 C)  TempSrc: Oral  Weight: 167 lb 8 oz (76 kg)   Physical Exam Constitutional:      General: He is not in acute distress.    Appearance: He is well-developed.  Cardiovascular:     Rate and Rhythm: Normal rate and regular rhythm.     Heart sounds: Normal heart sounds.  Pulmonary:     Effort: Pulmonary effort is normal. No respiratory distress.     Breath sounds: Normal breath sounds.  Abdominal:     General: Bowel sounds are normal. There is no distension.     Palpations: Abdomen is soft.     Tenderness: There is no abdominal tenderness.  Musculoskeletal:        General: No tenderness or deformity.     Comments: Trace bilateral LE edema R>L  Skin:    General: Skin is warm and dry.    Assessment & Plan:   See Encounters Tab for problem based charting.  Patient discussed with Dr. Rebeca Alert

## 2018-02-09 NOTE — Patient Instructions (Addendum)
Thank you for allowing [place] to care for you  For your High Blood Pressure - BP elevated today - Combination pill prescribed as two separate medications today as requested. - Recheck in 3 months  For your Enlarged Prostate - Glad your symptoms are improving - Continue current medications  For your muscle cramps and history low potassium - Repeat Labs today  For your Right leg pain - ABIs (test) today was abnormal - Referral to Vascular surgery orderted  - Colonoscopy ordered today - TDAP Vaccine given   Please follow up in about 3 months

## 2018-02-10 LAB — BMP8+ANION GAP
Anion Gap: 16 mmol/L (ref 10.0–18.0)
BUN/Creatinine Ratio: 11 (ref 10–24)
BUN: 14 mg/dL (ref 8–27)
CO2: 21 mmol/L (ref 20–29)
Calcium: 11.3 mg/dL — ABNORMAL HIGH (ref 8.6–10.2)
Chloride: 103 mmol/L (ref 96–106)
Creatinine, Ser: 1.22 mg/dL (ref 0.76–1.27)
GFR calc Af Amer: 69 mL/min/{1.73_m2} (ref >59)
GFR calc non Af Amer: 59 mL/min/{1.73_m2} — ABNORMAL LOW (ref >59)
Glucose: 82 mg/dL (ref 65–99)
Potassium: 3.2 mmol/L — ABNORMAL LOW (ref 3.5–5.2)
Sodium: 140 mmol/L (ref 134–144)

## 2018-02-10 NOTE — Progress Notes (Signed)
Internal Medicine Clinic Attending  Case discussed with Dr. Melvin at the time of the visit.  We reviewed the resident's history and exam and pertinent patient test results.  I agree with the assessment, diagnosis, and plan of care documented in the resident's note.  Alexander Raines, M.D., Ph.D.  

## 2018-02-16 ENCOUNTER — Encounter: Payer: Self-pay | Admitting: Gastroenterology

## 2018-03-01 ENCOUNTER — Encounter: Payer: Self-pay | Admitting: *Deleted

## 2018-03-16 ENCOUNTER — Telehealth: Payer: Self-pay | Admitting: Internal Medicine

## 2018-03-16 NOTE — Assessment & Plan Note (Signed)
ADDENDUM 03/16/2018  Called patient to inform him of his stable potassium value and his elevated Ca. We will continue to monitor his Potassium.   We will repeat his labs at follow up to evaluate if his Ca remains elevated and check his Potassium level. If Ca remains elevated, he will need referral to surgery for possible parathyroidectomy.  Instructed to return for evaluation if he experiences symptomatic hyperparathyroidism with flank pain (from possible stones), Bone pain, Nausea, or constipation.  Will also need to check albumin at follow up for corrected Ca level.

## 2018-03-16 NOTE — Telephone Encounter (Signed)
Called patient to inform him of his stable potassium value and his elevated Ca. We will continue to monitor his Potassium.   We will repeat his labs at follow up to evaluate if his Ca remains elevated and check his Potassium level. If Ca remains elevated, he will need referral to surgery for possible parathyroidectomy.  Instructed to return for evaluation if he experiences symptomatic hyperparathyroidism with flank pain (from possible stones), Bone pain, Nausea, or constipation.  Will also need to check albumin at follow up for corrected Ca level.  Pearson Grippe, DO IM PGY-2

## 2018-03-22 ENCOUNTER — Encounter: Payer: Self-pay | Admitting: Gastroenterology

## 2018-03-22 ENCOUNTER — Other Ambulatory Visit: Payer: Self-pay

## 2018-03-22 ENCOUNTER — Ambulatory Visit (AMBULATORY_SURGERY_CENTER): Payer: Self-pay | Admitting: *Deleted

## 2018-03-22 ENCOUNTER — Encounter: Payer: Medicare HMO | Admitting: Gastroenterology

## 2018-03-22 VITALS — Ht 71.0 in | Wt 164.0 lb

## 2018-03-22 DIAGNOSIS — Z86010 Personal history of colonic polyps: Secondary | ICD-10-CM

## 2018-03-22 MED ORDER — PEG 3350-KCL-NA BICARB-NACL 420 G PO SOLR: 4000.0000 mL | Freq: Once | ORAL | 0 refills | Status: AC

## 2018-03-22 NOTE — Progress Notes (Signed)
No egg or soy allergy known to patient  No issues with past sedation with any surgeries  or procedures, no intubation problems  No diet pills per patient No home 02 use per patient  No blood thinners per patient  Pt denies issues with constipation  No A fib or A flutter  EMMI video offered, no email account.

## 2018-04-02 ENCOUNTER — Other Ambulatory Visit: Payer: Self-pay | Admitting: Internal Medicine

## 2018-04-02 DIAGNOSIS — I10 Essential (primary) hypertension: Secondary | ICD-10-CM

## 2018-04-03 ENCOUNTER — Other Ambulatory Visit: Payer: Self-pay | Admitting: Internal Medicine

## 2018-04-03 DIAGNOSIS — E210 Primary hyperparathyroidism: Secondary | ICD-10-CM

## 2018-04-04 ENCOUNTER — Ambulatory Visit (AMBULATORY_SURGERY_CENTER): Payer: Medicare HMO | Admitting: Gastroenterology

## 2018-04-04 ENCOUNTER — Encounter: Payer: Self-pay | Admitting: Certified Registered Nurse Anesthetist

## 2018-04-04 VITALS — BP 123/64 | HR 63 | Temp 96.6°F | Resp 22 | Ht 71.0 in | Wt 167.0 lb

## 2018-04-04 DIAGNOSIS — D123 Benign neoplasm of transverse colon: Secondary | ICD-10-CM

## 2018-04-04 DIAGNOSIS — D122 Benign neoplasm of ascending colon: Secondary | ICD-10-CM

## 2018-04-04 DIAGNOSIS — Z860100 Personal history of colon polyps, unspecified: Secondary | ICD-10-CM

## 2018-04-04 DIAGNOSIS — K5730 Diverticulosis of large intestine without perforation or abscess without bleeding: Secondary | ICD-10-CM

## 2018-04-04 DIAGNOSIS — D125 Benign neoplasm of sigmoid colon: Secondary | ICD-10-CM

## 2018-04-04 MED ORDER — SODIUM CHLORIDE 0.9 % IV SOLN: 500.0000 mL | Freq: Once | INTRAVENOUS | Status: DC

## 2018-04-04 NOTE — Patient Instructions (Signed)
Handouts given for polyps and diverticulosis.  YOU HAD AN ENDOSCOPIC PROCEDURE TODAY AT [facility]:   Refer to the procedure report that was given to you for any specific questions about what was found during the examination.  If the procedure report does not answer your questions, please call your gastroenterologist to clarify.  If you requested that your care partner not be given the details of your procedure findings, then the procedure report has been included in a sealed envelope for you to review at your convenience later.  YOU SHOULD EXPECT: Some feelings of bloating in the abdomen. Passage of more gas than usual.  Walking can help get rid of the air that was put into your GI tract during the procedure and reduce the bloating. If you had a lower endoscopy (such as a colonoscopy or flexible sigmoidoscopy) you may notice spotting of blood in your stool or on the toilet paper. If you underwent a bowel prep for your procedure, you may not have a normal bowel movement for a few days.  Please Note:  You might notice some irritation and congestion in your nose or some drainage.  This is from the oxygen used during your procedure.  There is no need for concern and it should clear up in a day or so.  SYMPTOMS TO REPORT IMMEDIATELY:   Following lower endoscopy (colonoscopy or flexible sigmoidoscopy):  Excessive amounts of blood in the stool  Significant tenderness or worsening of abdominal pains  Swelling of the abdomen that is new, acute  Fever of 100F or higher   For urgent or emergent issues, a gastroenterologist can be reached at any hour by calling [phone].   DIET:  We do recommend a small meal at first, but then you may proceed to your regular diet.  Drink plenty of fluids but you should avoid alcoholic beverages for 24 hours.  ACTIVITY:  You should plan to take it easy for the rest of today and you should NOT DRIVE or use heavy machinery until tomorrow (because of  the sedation medicines used during the test).    FOLLOW UP: Our staff will call the number listed on your records the next business day following your procedure to check on you and address any questions or concerns that you may have regarding the information given to you following your procedure. If we do not reach you, we will leave a message.  However, if you are feeling well and you are not experiencing any problems, there is no need to return our call.  We will assume that you have returned to your regular daily activities without incident.  If any biopsies were taken you will be contacted by phone or by letter within the next 1-3 weeks.  Please call us at [phone] if you have not heard about the biopsies in 3 weeks.    SIGNATURES/CONFIDENTIALITY: You and/or your care partner have signed paperwork which will be entered into your electronic medical record.  These signatures attest to the fact that that the information above on your After Visit Summary has been reviewed and is understood.  Full responsibility of the confidentiality of this discharge information lies with you and/or your care-partner.

## 2018-04-04 NOTE — Op Note (Addendum)
[facility] Patient Name: Bryan Vincent Procedure Date: 04/04/2018 9:21 AM MRN: [id number] Endoscopist: Milus Banister , MD Age: 73 Referring MD:  Date of Birth: 08/21/1945 Gender: Male Account #: [id number] Procedure:                Colonoscopy Indications:              High risk colon cancer surveillance: Personal                            history of colonic polyps; 2008 colonoscopy                            adenomas removed, colonoscopy 2010 five adenomas                            removed, colonoscopy 2015 one adenoma removed. Medicines:                Monitored Anesthesia Care Procedure:                Pre-Anesthesia Assessment:                           - Prior to the procedure, a History and Physical                            was performed, and patient medications and                            allergies were reviewed. The patient's tolerance of                            previous anesthesia was also reviewed. The risks                            and benefits of the procedure and the sedation                            options and risks were discussed with the patient.                            All questions were answered, and informed consent                            was obtained. Prior Anticoagulants: The patient has                            taken no previous anticoagulant or antiplatelet                            agents. ASA Grade Assessment: II - A patient with                            mild systemic disease. After reviewing the risks  and benefits, the patient was deemed in                            satisfactory condition to undergo the procedure.                           After obtaining informed consent, the colonoscope                            was passed under direct vision. Throughout the                            procedure, the patient's blood pressure, pulse, and                            oxygen saturations were  monitored continuously. The                            Colonoscope was introduced through the anus and                            advanced to the the cecum, identified by                            appendiceal orifice and ileocecal valve. The                            colonoscopy was performed without difficulty. The                            patient tolerated the procedure well. The quality                            of the bowel preparation was good. The ileocecal                            valve, appendiceal orifice, and rectum were                            photographed. Scope In: 9:24:04 AM Scope Out: 9:43:01 AM Scope Withdrawal Time: 0 hours 14 minutes 33 seconds  Total Procedure Duration: 0 hours 18 minutes 57 seconds  Findings:                 Three sessile polyps were found in the sigmoid                            colon, transverse colon and ascending colon. The                            polyps were 3 to 7 mm in size. These polyps were                            removed with a cold snare. Resection and retrieval  were complete.                           A 10 mm polyp was found in the transverse colon.                            The polyp was sessile. The polyp was removed with a                            hot snare. Resection and retrieval were complete.                           Multiple small and large-mouthed diverticula were                            found in the entire colon.                           The exam was otherwise without abnormality on                            direct and retroflexion views. Complications:            No immediate complications. Estimated blood loss:                            None. Estimated Blood Loss:     Estimated blood loss: none. Impression:               - Three 3 to 7 mm polyps in the sigmoid colon, in                            the transverse colon and in the ascending colon,                             removed with a cold snare. Resected and retrieved.                           - One 10 mm polyp in the transverse colon, removed                            with a hot snare. Resected and retrieved.                           - Diverticulosis in the entire examined colon.                           - The examination was otherwise normal on direct                            and retroflexion views. Recommendation:           - Patient has a contact number available for  emergencies. The signs and symptoms of potential                            delayed complications were discussed with the                            patient. Return to normal activities tomorrow.                            Written discharge instructions were provided to the                            patient.                           - Resume previous diet.                           - Continue present medications.                           You will receive a letter within 2-3 weeks with the                            pathology results and my final recommendations.                           If the polyp(s) is proven to be 'pre-cancerous' on                            pathology, you will need repeat colonoscopy in 3                            years. Milus Banister, MD 04/04/2018 9:48:28 AM This report has been signed electronically.

## 2018-04-04 NOTE — Progress Notes (Signed)
PT taken to PACU. Monitors in place. VSS. Report given to RN. 

## 2018-04-04 NOTE — Progress Notes (Signed)
Called to room to assist during endoscopic procedure.  Patient ID and intended procedure confirmed with present staff. Received instructions for my participation in the procedure from the performing physician.  

## 2018-04-04 NOTE — Progress Notes (Signed)
Pt's states no medical or surgical changes since previsit or office visit. 

## 2018-04-05 ENCOUNTER — Telehealth: Payer: Self-pay

## 2018-04-05 NOTE — Telephone Encounter (Signed)
  Follow up Call-  Call back number 04/04/2018  Post procedure Call Back phone  # [phone]  Permission to leave phone message Yes  Some recent data might be hidden     Patient questions:  Do you have a fever, pain , or abdominal swelling? No. Pain Score  0 *  Have you tolerated food without any problems? Yes.    Have you been able to return to your normal activities? Yes.    Do you have any questions about your discharge instructions: Diet   No. Medications  No. Follow up visit  No.  Do you have questions or concerns about your Care? No.  Actions: * If pain score is 4 or above: No action needed, pain <4.

## 2018-04-05 NOTE — Telephone Encounter (Signed)
Refill approved.

## 2018-04-10 ENCOUNTER — Encounter: Payer: Self-pay | Admitting: Gastroenterology

## 2018-04-17 ENCOUNTER — Ambulatory Visit: Payer: Medicare HMO | Admitting: Cardiology

## 2018-04-21 ENCOUNTER — Ambulatory Visit: Payer: Medicare HMO | Admitting: Podiatry

## 2018-04-21 ENCOUNTER — Encounter: Payer: Self-pay | Admitting: Podiatry

## 2018-04-21 ENCOUNTER — Other Ambulatory Visit: Payer: Self-pay

## 2018-04-21 DIAGNOSIS — L84 Corns and callosities: Secondary | ICD-10-CM

## 2018-04-21 DIAGNOSIS — G629 Polyneuropathy, unspecified: Secondary | ICD-10-CM

## 2018-04-21 DIAGNOSIS — M79676 Pain in unspecified toe(s): Secondary | ICD-10-CM

## 2018-04-21 DIAGNOSIS — B351 Tinea unguium: Secondary | ICD-10-CM | POA: Diagnosis not present

## 2018-04-21 NOTE — Patient Instructions (Signed)

## 2018-05-01 ENCOUNTER — Encounter: Payer: Self-pay | Admitting: Podiatry

## 2018-05-01 NOTE — Progress Notes (Signed)
Subjective: Bryan Vincent presents today with painful, thick toenails 1-5 b/l that he cannot cut and which interfere with daily activities.  Pain is aggravated when wearing enclosed shoe gear.  Bryan Seat, MD is his PCP.    Current Outpatient Medications:  .  amLODipine (NORVASC) 10 MG tablet, Take 1 tablet (10 mg total) by mouth daily., Disp: 30 tablet, Rfl: 11 .  aspirin 81 MG tablet, Take 81 mg by mouth daily., Disp: , Rfl:  .  carvedilol (COREG) 6.25 MG tablet, TAKE 1 TABLET BY MOUTH 2 TIMES DAILY WITH A MEAL, Disp: 180 tablet, Rfl: 0 .  Cholecalciferol (VITAMIN D) 50 MCG (2000 UT) tablet, TAKE 1 TABLET BY MOUTH EVERY DAY, Disp: 90 tablet, Rfl: 1 .  COMBIGAN 0.2-0.5 % ophthalmic solution, , Disp: , Rfl:  .  finasteride (PROSCAR) 5 MG tablet, TAKE 1 TABLET EVERY DAY, Disp: 90 tablet, Rfl: 3 .  ibuprofen (ADVIL,MOTRIN) 800 MG tablet, Take 800 mg by mouth 3 (three) times daily., Disp: , Rfl: 12 .  latanoprost (XALATAN) 0.005 % ophthalmic solution, , Disp: , Rfl:  .  LUMIGAN 0.01 % SOLN, , Disp: , Rfl:  .  tamsulosin (FLOMAX) 0.4 MG CAPS capsule, Take 1 capsule (0.4 mg total) by mouth daily., Disp: 90 capsule, Rfl: 2 .  valsartan (DIOVAN) 160 MG tablet, Take 2 tablets (320 mg total) by mouth daily., Disp: 180 tablet, Rfl: 3 .  Vitamin D, Ergocalciferol, (DRISDOL) 50000 units CAPS capsule, Take 1 capsule (50,000 Units total) by mouth every 7 (seven) days., Disp: 8 capsule, Rfl: 0  Current Facility-Administered Medications:  .  0.9 %  sodium chloride infusion, 500 mL, Intravenous, Once, Milus Banister, MD  No Known Allergies  Objective:  Vascular Examination: Capillary refill time less than 4 seconds x 10 digits  Dorsalis pedis and Posterior tibial pulses palpable b/l  Digital hair absent x 10 digits  Skin temperature gradient WNL b/l  Dermatological Examination: Skin with normal turgor, texture and tone b/l  Toenails 1-5 b/l discolored, thick, dystrophic with subungual  debris and pain with palpation to nailbeds due to thickness of nails.  Hyperkeratotic lesion b/l callus. No erythema, no edema, no drainage, no flocculence noted.  Musculoskeletal: Muscle strength 5/5 to all LE muscle groups  HAV with bunion b/l.  Hammertoes 2-5 b/l.  No pain, crepitus or joint limitation noted with ROM.   Neurological: Sensation diminished with 10 gram monofilament.  Assessment: 1. Painful onychomycosis toenails 1-5 b/l  2. Callus b/l hallux 3. Neuropathy  Plan: 1. Toenails 1-5 b/l were debrided in length and girth without iatrogenic bleeding. Calluses pared b/l hallux utilizing sterile scalpel blade without incident. 2. Patient to continue soft, supportive shoe gear daily. 3. Patient to report any pedal injuries to medical professional immediately. 4. Follow up 3 months.  5. Patient/POA to call should there be a concern in the interim.

## 2018-05-02 ENCOUNTER — Telehealth: Payer: Self-pay | Admitting: Cardiovascular Disease

## 2018-05-02 DIAGNOSIS — I739 Peripheral vascular disease, unspecified: Secondary | ICD-10-CM

## 2018-05-02 NOTE — Telephone Encounter (Signed)
New Message:   Pt wants to know if he is still scheduled to see Dr Gwenlyn Found on Friday(05-05-18)?

## 2018-05-02 NOTE — Telephone Encounter (Signed)
Returned call to pt he states that he has a NEW pt appt scheduled for Friday. He is experiencing pain in his RLE he states that this only happens when he walks greater than 1 block, and he will rest then walk another block and rest, and so forth. He states that he does not drive, so he must walk a couple blocks to get to the grocery. Pt denies any redness or heat to the touch or swelling(pain and swelling only happen when he walks). He still wants to be seen to get this taken care of as this is impeding him from going to the grocery. Please advise

## 2018-05-03 NOTE — Telephone Encounter (Signed)
Spoke with pt, LEA's and follow up appointment rescheduled for 06-09-2018.

## 2018-05-03 NOTE — Telephone Encounter (Signed)
The OP rudimentary ABIs really are not very helpful and it's not efficient for me to see him without LEAs. Since he doesn't have critical limb ischemia , only claudication, I'd prefer seeing him in the office in 3 months after we get real LEAs here.  JJB

## 2018-05-05 ENCOUNTER — Other Ambulatory Visit: Payer: Self-pay | Admitting: Internal Medicine

## 2018-05-05 ENCOUNTER — Ambulatory Visit: Payer: Medicare HMO | Admitting: Cardiovascular Disease

## 2018-05-05 DIAGNOSIS — I10 Essential (primary) hypertension: Secondary | ICD-10-CM

## 2018-05-05 NOTE — Telephone Encounter (Signed)
Refill Approved

## 2018-05-11 ENCOUNTER — Encounter: Payer: Self-pay | Admitting: Internal Medicine

## 2018-05-11 ENCOUNTER — Other Ambulatory Visit: Payer: Self-pay

## 2018-05-11 ENCOUNTER — Ambulatory Visit (INDEPENDENT_AMBULATORY_CARE_PROVIDER_SITE_OTHER): Payer: Medicare HMO | Admitting: Internal Medicine

## 2018-05-11 DIAGNOSIS — N400 Enlarged prostate without lower urinary tract symptoms: Secondary | ICD-10-CM

## 2018-05-11 DIAGNOSIS — N401 Enlarged prostate with lower urinary tract symptoms: Secondary | ICD-10-CM

## 2018-05-11 DIAGNOSIS — M79604 Pain in right leg: Secondary | ICD-10-CM | POA: Diagnosis not present

## 2018-05-11 DIAGNOSIS — R350 Frequency of micturition: Secondary | ICD-10-CM

## 2018-05-11 DIAGNOSIS — E210 Primary hyperparathyroidism: Secondary | ICD-10-CM | POA: Diagnosis not present

## 2018-05-11 DIAGNOSIS — I10 Essential (primary) hypertension: Secondary | ICD-10-CM

## 2018-05-11 DIAGNOSIS — Z79899 Other long term (current) drug therapy: Secondary | ICD-10-CM

## 2018-05-11 MED ORDER — CARVEDILOL 6.25 MG PO TABS: ORAL_TABLET | ORAL | 1 refills | Status: DC

## 2018-05-11 MED ORDER — TAMSULOSIN HCL 0.4 MG PO CAPS: 0.4000 mg | ORAL_CAPSULE | Freq: Every day | ORAL | 2 refills | Status: DC

## 2018-05-11 NOTE — Progress Notes (Addendum)
  This is a telephone encounter between Bryan Vincent and Bryan Vincent on 05/11/2018. The visit was conducted with the patient located at home and Bryan Vincent at [facility]. The patient's identity was confirmed using their DOB and current address. The patient has consented to being evaluated through a telephone encounter and understands the associated risks (an examination cannot be done and the patient may need to come in for an appointment) / benefits (allows the patient to remain at home, decreasing exposure to coronavirus). I personally spent 17 minutes on medical discussion.    CC:  HTN, RLE claudication, BPH, Primary hyperparathyroidism  HPI:  [name] is a 73 y.o. M with PMHx listed below presenting for HTN, RLE claudication, BPH, Primary hyperparathyroidism. Please see the A&P for the status of the patient's chronic medical problems.   Past Medical History:  Diagnosis Date  . Arthritis   . Benign prostatic hyperplasia without lower urinary tract symptoms 06/15/2016  . BPH (benign prostatic hyperplasia)   . Cramp in limb 02/09/2018  . Edema of right lower extremity 06/15/2016  . Glaucoma   . Hypertension   . Hypokalemia 11/03/2017  . Macrocytosis without anemia 01/27/2017  . Pain of right lower extremity 02/09/2018  . Primary hyperparathyroidism ([place]) 01/27/2017   Diagnosed March 2019. - PTH 108, Ca 11 (<1 over ULN), eGFR 85 (>60)  - DEXA: T: -1.1 (ostepenia not indication for surgery) - No bone pain, renal stones, constipation, or nausea - 24-hr urinary calcium not done - Supplementing Vit D   . Tachycardia 01/19/2017  . Tobacco abuse 01/26/2017  . Vision blurred 04/14/2017   Review of Systems:  Performed and all others negative.  Physical Exam:  There were no vitals filed for this visit. No exam performed for telehealth visit  Assessment & Plan:   See Encounters Tab for problem based charting.  Patient discussed with Dr. Beryle Beams

## 2018-05-11 NOTE — Assessment & Plan Note (Signed)
Patient continues to describe RLE pain with walking, suspicious for claudication given his previous POC ABI results, which were borderline. He has referral to see Dr. Gwenlyn Found for further evaluation on 06/09/2018. Pain remain stable at this time. - Follow up with Dr. Gwenlyn Found as planned

## 2018-05-11 NOTE — Assessment & Plan Note (Signed)
Discussed that we will delay his Ca and Albumin recheck until he is able to be seen in person provided he does not develop symptoms. At this time elective surgeries are not being offered so a general surgery referral would not likely be carried out soon. Will plan to recheck when seen again in the office. - CMP at follow up - Consider Gen Surg referral if elevated

## 2018-05-11 NOTE — Assessment & Plan Note (Signed)
History of BPH. Has been doing well since resome dual therapy of finasteride and Tamsulosin. Refill of Tamsulosin provided today. - Finasteride 5mg  Daily - Resume Tamsulosin 0.4mg  Dialy

## 2018-05-11 NOTE — Assessment & Plan Note (Signed)
Patient does check his BP at home. yesterday BP was 138/78. His highest recently was 316 systolic. We will continue current regimen at this time. Refill provided for Coreg as he is running low. - Amlodipine 10mg  Daily - Valsartan 320mg  Daily - Carvedilol 6.25 BID

## 2018-05-11 NOTE — Progress Notes (Signed)
Medicine attending: Medical history, presenting problems,  and medications, reviewed with resident physician Dr Pearson Grippe on the day of the patient telephone consultation and I concur with his evaluation and management plan.

## 2018-05-11 NOTE — Patient Instructions (Signed)
Verbal Instructions provided as this was a tele-health encounter

## 2018-05-17 ENCOUNTER — Telehealth: Payer: Self-pay | Admitting: Internal Medicine

## 2018-05-17 DIAGNOSIS — M79676 Pain in unspecified toe(s): Principal | ICD-10-CM

## 2018-05-17 DIAGNOSIS — B351 Tinea unguium: Secondary | ICD-10-CM

## 2018-05-17 NOTE — Telephone Encounter (Signed)
Rec'd a call fron the [facility] requesting an Authorization for a Diabetic Nail trim.  Pt's [employer] ins requires Authorization.  Please Advise if you can place a new referral for his upcoming appt in June.

## 2018-05-19 NOTE — Telephone Encounter (Signed)
Referral place to podiatry due to his ongoing pain for onychomycosis. He does not have a diagnosis of diabetes, but a have associated the referral with his known diagnosis that he was seen at podiatry for most recently.  Thank you,   Pearson Grippe

## 2018-06-09 ENCOUNTER — Encounter (HOSPITAL_COMMUNITY): Payer: Medicare HMO

## 2018-06-09 ENCOUNTER — Ambulatory Visit: Payer: Medicare HMO | Admitting: Cardiovascular Disease

## 2018-06-09 ENCOUNTER — Telehealth: Payer: Self-pay | Admitting: Cardiovascular Disease

## 2018-06-09 NOTE — Telephone Encounter (Signed)
Pt appt rescheduled for 6/5

## 2018-06-09 NOTE — Telephone Encounter (Signed)
New Message     Pt just called, he will do a telephone visit today. Please call him at [phone].

## 2018-06-15 ENCOUNTER — Other Ambulatory Visit: Payer: Self-pay

## 2018-06-15 ENCOUNTER — Ambulatory Visit: Payer: Medicare HMO | Admitting: Podiatry

## 2018-06-15 ENCOUNTER — Encounter: Payer: Self-pay | Admitting: Podiatry

## 2018-06-15 VITALS — Temp 98.1°F

## 2018-06-15 DIAGNOSIS — G629 Polyneuropathy, unspecified: Secondary | ICD-10-CM | POA: Diagnosis not present

## 2018-06-15 DIAGNOSIS — B351 Tinea unguium: Secondary | ICD-10-CM | POA: Diagnosis not present

## 2018-06-19 NOTE — Progress Notes (Signed)
Subjective: 73 year old male presents the office today apparently to discuss nail fungus treatments is what he was told but does not know why he is at the appointment today.  He does come now.  Bracing debridement.  He has no new concerns today. Denies any systemic complaints such as fevers, chills, nausea, vomiting. No acute changes since last appointment, and no other complaints at this time.   Objective: NAD DP/PT pulses palpable bilaterally, CRT less than 3 seconds Nails are hypertrophic, dystrophic, brittle, discolored, elongated 10. No surrounding redness or drainage. Tenderness nails 1-5 bilaterally. No open lesions or pre-ulcerative lesions are identified today.  No open lesions or pre-ulcerative lesions.  No pain with calf compression, swelling, warmth, erythema  Assessment: Onychomycosis  Plan: -All treatment options discussed with the patient including all alternatives, risks, complications.  -Debrided toenails x10 without any complications or bleeding.  We discussed nail fungus treatments.  We discussed topical medications as well as oral medication.  He does not want to proceed with any treatment at this time.  However if he wants to proceed with treatment we can start with [place].  -Patient encouraged to call the office with any questions, concerns, change in symptoms.   Trula Slade DPM

## 2018-07-07 ENCOUNTER — Other Ambulatory Visit: Payer: Self-pay | Admitting: Cardiovascular Disease

## 2018-07-07 DIAGNOSIS — I739 Peripheral vascular disease, unspecified: Secondary | ICD-10-CM

## 2018-07-14 ENCOUNTER — Inpatient Hospital Stay (HOSPITAL_COMMUNITY): Admission: RE | Admit: 2018-07-14 | Payer: Medicare HMO | Source: Ambulatory Visit

## 2018-07-14 ENCOUNTER — Ambulatory Visit: Payer: Medicare HMO | Admitting: Cardiovascular Disease

## 2018-07-21 ENCOUNTER — Ambulatory Visit: Payer: Medicare HMO | Admitting: Cardiovascular Disease

## 2018-07-24 ENCOUNTER — Other Ambulatory Visit: Payer: Self-pay

## 2018-07-24 ENCOUNTER — Ambulatory Visit (HOSPITAL_COMMUNITY)
Admission: RE | Admit: 2018-07-24 | Discharge: 2018-07-24 | Disposition: A | Discharge status: Home / Self Care | Payer: Medicare HMO | Source: Ambulatory Visit | Attending: Internal Medicine | Admitting: Internal Medicine

## 2018-07-24 DIAGNOSIS — I739 Peripheral vascular disease, unspecified: Secondary | ICD-10-CM | POA: Insufficient documentation

## 2018-07-28 ENCOUNTER — Ambulatory Visit: Payer: Medicare HMO | Admitting: Podiatry

## 2018-08-04 ENCOUNTER — Telehealth: Payer: Self-pay

## 2018-08-04 NOTE — Telephone Encounter (Signed)
lmtcb to set up appt with Dr. Gwenlyn Found  Attempting for 7/1 appt

## 2018-08-05 ENCOUNTER — Encounter (HOSPITAL_COMMUNITY): Payer: Self-pay | Admitting: Emergency Medicine

## 2018-08-05 ENCOUNTER — Other Ambulatory Visit: Payer: Self-pay

## 2018-08-05 ENCOUNTER — Emergency Department (HOSPITAL_COMMUNITY): Payer: Medicare HMO

## 2018-08-05 ENCOUNTER — Emergency Department (HOSPITAL_COMMUNITY)
Admission: EM | Admit: 2018-08-05 | Discharge: 2018-08-05 | Disposition: A | Discharge status: Home / Self Care | Payer: Medicare HMO | Attending: Emergency Medicine | Admitting: Emergency Medicine

## 2018-08-05 DIAGNOSIS — Y929 Unspecified place or not applicable: Secondary | ICD-10-CM | POA: Diagnosis not present

## 2018-08-05 DIAGNOSIS — F17210 Nicotine dependence, cigarettes, uncomplicated: Secondary | ICD-10-CM | POA: Diagnosis not present

## 2018-08-05 DIAGNOSIS — I10 Essential (primary) hypertension: Secondary | ICD-10-CM | POA: Diagnosis not present

## 2018-08-05 DIAGNOSIS — Y999 Unspecified external cause status: Secondary | ICD-10-CM | POA: Insufficient documentation

## 2018-08-05 DIAGNOSIS — Y939 Activity, unspecified: Secondary | ICD-10-CM | POA: Diagnosis not present

## 2018-08-05 DIAGNOSIS — W010XXA Fall on same level from slipping, tripping and stumbling without subsequent striking against object, initial encounter: Secondary | ICD-10-CM | POA: Insufficient documentation

## 2018-08-05 DIAGNOSIS — M546 Pain in thoracic spine: Secondary | ICD-10-CM | POA: Insufficient documentation

## 2018-08-05 DIAGNOSIS — Z79899 Other long term (current) drug therapy: Secondary | ICD-10-CM | POA: Diagnosis not present

## 2018-08-05 DIAGNOSIS — M545 Low back pain: Secondary | ICD-10-CM | POA: Diagnosis not present

## 2018-08-05 DIAGNOSIS — S32010A Wedge compression fracture of first lumbar vertebra, initial encounter for closed fracture: Secondary | ICD-10-CM | POA: Diagnosis not present

## 2018-08-05 DIAGNOSIS — E210 Primary hyperparathyroidism: Secondary | ICD-10-CM | POA: Diagnosis not present

## 2018-08-05 DIAGNOSIS — S299XXA Unspecified injury of thorax, initial encounter: Secondary | ICD-10-CM | POA: Diagnosis not present

## 2018-08-05 DIAGNOSIS — S32000A Wedge compression fracture of unspecified lumbar vertebra, initial encounter for closed fracture: Secondary | ICD-10-CM | POA: Diagnosis not present

## 2018-08-05 MED ORDER — TRAMADOL HCL 50 MG PO TABS
50.0000 mg | ORAL_TABLET | Freq: Once | ORAL | Status: AC
  Administered 2018-08-05: 50 mg via ORAL
  Filled 2018-08-05: qty 1

## 2018-08-05 MED ORDER — TRAMADOL HCL 50 MG PO TABS: 50.0000 mg | ORAL_TABLET | Freq: Four times a day (QID) | ORAL | 0 refills | Status: DC | PRN

## 2018-08-05 NOTE — ED Notes (Signed)
Patient verbalizes understanding of discharge instructions . Opportunity for questions and answers were provided . Armband removed by staff ,Pt discharged from ED. W/C  offered at D/C  and Declined W/C at D/C and was escorted to lobby by RN.  

## 2018-08-05 NOTE — Discharge Instructions (Addendum)
It was our pleasure to provide your ER care today - we hope that you feel better.  Your xrays show a L1 compression fracture.   Wear back brace for comfort/support.   Take ultram as need for pain - no driving when taking.  Follow up with primary care doctor in 1-2 weeks.  Return to ER if worse, new symptoms, intractable pain, numbness/weakness, trouble controlling urine or bowel movements, fevers, other concern.

## 2018-08-05 NOTE — ED Triage Notes (Signed)
PT has back brace

## 2018-08-05 NOTE — ED Notes (Signed)
Ortho tech paged  

## 2018-08-05 NOTE — Progress Notes (Signed)
Orthopedic Tech Progress Note Patient Details:  Bryan Vincent [birth year] [id number]  Patient ID: Bryan Vincent, male   DOB: 06/13/1945, 73 y.o.   MRN: [id number]   Bryan Vincent 08/05/2018, 3:58 PMCalled [employer] for Lumbar sacral brace.
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
## 2018-08-05 NOTE — ED Triage Notes (Signed)
PT updated on wait for back brace

## 2018-08-05 NOTE — ED Triage Notes (Addendum)
Pt reports mechanical fall x 1 week ago, denies hitting head or LOC, reports constant lower back pain. Able to walk, no loss of bowel or bladder function.

## 2018-08-05 NOTE — ED Notes (Signed)
Patient transported to X-ray 

## 2018-08-24 ENCOUNTER — Ambulatory Visit (INDEPENDENT_AMBULATORY_CARE_PROVIDER_SITE_OTHER): Payer: Medicare HMO | Admitting: Cardiovascular Disease

## 2018-08-24 ENCOUNTER — Encounter: Payer: Self-pay | Admitting: Cardiovascular Disease

## 2018-08-24 ENCOUNTER — Telehealth: Payer: Self-pay | Admitting: Cardiovascular Disease

## 2018-08-24 ENCOUNTER — Other Ambulatory Visit: Payer: Self-pay

## 2018-08-24 VITALS — BP 157/86 | HR 71 | Temp 97.2°F | Ht 71.0 in | Wt 150.0 lb

## 2018-08-24 DIAGNOSIS — I739 Peripheral vascular disease, unspecified: Secondary | ICD-10-CM | POA: Diagnosis not present

## 2018-08-24 DIAGNOSIS — Z01812 Encounter for preprocedural laboratory examination: Secondary | ICD-10-CM | POA: Diagnosis not present

## 2018-08-24 DIAGNOSIS — I10 Essential (primary) hypertension: Secondary | ICD-10-CM | POA: Diagnosis not present

## 2018-08-24 DIAGNOSIS — Z720 Tobacco use: Secondary | ICD-10-CM

## 2018-08-24 NOTE — Assessment & Plan Note (Signed)
Bryan Vincent Nephew referred to me by Dr. Trilby Drummer for evaluation of claudication.  He has had right calf claudication for 2 years which is lifestyle limiting.  He had Dopplers performed in our office 07/24/2018 revealing a right ABI of 0.75 and a left of 0.67.  He had a high-frequency signal of the proximal portion of his right SFA and moderate left SFA stenosis.  We will plan peripheral angiography and endovascular therapy in the next several weeks.

## 2018-08-24 NOTE — Telephone Encounter (Signed)
LVM, reminding pt of his appt with  Dr Gwenlyn Found on 08-24-18.

## 2018-08-24 NOTE — Assessment & Plan Note (Signed)
Lifelong history tobacco abuse currently smoking 10 cigarettes a day.  He smoked for over 50 years.

## 2018-08-24 NOTE — Progress Notes (Signed)
08/24/2018 Bryan Vincent   [birth year]  [id number]  Primary Physician [name], MD Primary Cardiologist: Lorretta Harp MD Lupe Carney, [place]  HPI:  Bryan Vincent is a 73 y.o. thin appearing single African-American male with no children who is retired from working in a nursing home and was referred by Dr. Trilby Drummer for peripheral vascular valuation because of right calf claudication.  He has a history of treated hypertension, long history tobacco abuse.  There is no family history appears never had a heart attack or stroke.  He denies chest pain or shortness of breath.  The claudication is right calf for approximately 2 years with recent Dopplers performed 07/24/2018 revealing a right ABI 0.75 and a left of 0.67 with a high-frequency signal in his proximal right SFA.   Current Meds  Medication Sig   amLODipine (NORVASC) 10 MG tablet Take 1 tablet (10 mg total) by mouth daily.   aspirin 81 MG tablet Take 81 mg by mouth daily.   carvedilol (COREG) 6.25 MG tablet TAKE 1 TABLET TWICE DAILY WITH A MEAL   Cholecalciferol (VITAMIN D) 50 MCG (2000 UT) tablet TAKE 1 TABLET BY MOUTH EVERY DAY   COMBIGAN 0.2-0.5 % ophthalmic solution    finasteride (PROSCAR) 5 MG tablet TAKE 1 TABLET EVERY DAY   ibuprofen (ADVIL,MOTRIN) 800 MG tablet Take 800 mg by mouth 3 (three) times daily.   latanoprost (XALATAN) 0.005 % ophthalmic solution    LUMIGAN 0.01 % SOLN    tamsulosin (FLOMAX) 0.4 MG CAPS capsule Take 1 capsule (0.4 mg total) by mouth daily.   traMADol (ULTRAM) 50 MG tablet Take 1 tablet (50 mg total) by mouth every 6 (six) hours as needed.   Vitamin D, Ergocalciferol, (DRISDOL) 50000 units CAPS capsule Take 1 capsule (50,000 Units total) by mouth every 7 (seven) days.   Current Facility-Administered Medications for the 08/24/18 encounter (Office Visit) with Lorretta Harp, MD  Medication   0.9 %  sodium chloride infusion     No Known Allergies  Social History    Socioeconomic History   Marital status: Single    Spouse name: Not on file   Number of children: 0   Years of education: 12   Highest education level: Not on file  Occupational History   Occupation: Disability    Comment: Leg injury from [place] resource strain: Not on file   Food insecurity    Worry: Not on file    Inability: Not on file   Transportation needs    Medical: Not on file    Non-medical: Not on file  Tobacco Use   Smoking status: Current Every Day Smoker    Packs/day: 0.25    Years: 54.00    Pack years: 13.50   Smokeless tobacco: Never Used   Tobacco comment: 6 per day   Substance and Sexual Activity   Alcohol use: Yes    Comment: 2-3 quarts beer a day   Drug use: No   Sexual activity: Not on file  Lifestyle   Physical activity    Days per week: Not on file    Minutes per session: Not on file   Stress: Not on file  Relationships   Social connections    Talks on phone: Not on file    Gets together: Not on file    Attends religious service: Not on file    Active member of club or organization: Not on file  Attends meetings of clubs or organizations: Not on file    Relationship status: Not on file   Intimate partner violence    Fear of current or ex partner: Not on file    Emotionally abused: Not on file    Physically abused: Not on file    Forced sexual activity: Not on file  Other Topics Concern   Not on file  Social History Narrative   Fun/Hobby: Watch sports     Review of Systems: General: negative for chills, fever, night sweats or weight changes.  Cardiovascular: negative for chest pain, dyspnea on exertion, edema, orthopnea, palpitations, paroxysmal nocturnal dyspnea or shortness of breath Dermatological: negative for rash Respiratory: negative for cough or wheezing Urologic: negative for hematuria Abdominal: negative for nausea, vomiting, diarrhea, bright red blood per rectum, melena, or  hematemesis Neurologic: negative for visual changes, syncope, or dizziness All other systems reviewed and are otherwise negative except as noted above.    Blood pressure (!) 157/86, pulse 71, temperature (!) 97.2 F (36.2 C), height 5\' 11"  (1.803 m), weight 150 lb (68 kg), SpO2 96 %.  General appearance: alert and no distress Neck: no adenopathy, no carotid bruit, no JVD, supple, symmetrical, trachea midline and thyroid not enlarged, symmetric, no tenderness/mass/nodules Lungs: clear to auscultation bilaterally Heart: regular rate and rhythm, S1, S2 normal, no murmur, click, rub or gallop Extremities: extremities normal, atraumatic, no cyanosis or edema Pulses: 2+ and symmetric Skin: Skin color, texture, turgor normal. No rashes or lesions Neurologic: Alert and oriented X 3, normal strength and tone. Normal symmetric reflexes. Normal coordination and gait  EKG sinus bradycardia 57 with septal Q waves.  He also had early repolarization changes.  I personally reviewed this EKG.  ASSESSMENT AND PLAN:   Claudication in peripheral vascular disease [facility]) [name] referred to me by Dr. Trilby Drummer for evaluation of claudication.  He has had right calf claudication for 2 years which is lifestyle limiting.  He had Dopplers performed in our office 07/24/2018 revealing a right ABI of 0.75 and a left of 0.67.  He had a high-frequency signal of the proximal portion of his right SFA and moderate left SFA stenosis.  We will plan peripheral angiography and endovascular therapy in the next several weeks.  Essential hypertension History of essential hypertension with blood pressure measured today at 157/86.  He is on amlodipine, carvedilol and valsartan.  Tobacco abuse Lifelong history tobacco abuse currently smoking 10 cigarettes a day.  He smoked for over 50 years.      Lorretta Harp MD FACP,FACC,FAHA, [facility] 08/24/2018 4:19 PM

## 2018-08-24 NOTE — Assessment & Plan Note (Signed)
History of essential hypertension with blood pressure measured today at 157/86.  He is on amlodipine, carvedilol and valsartan.

## 2018-08-25 ENCOUNTER — Encounter: Payer: Self-pay | Admitting: Cardiovascular Disease

## 2018-08-25 NOTE — Patient Instructions (Addendum)
[place] [address] Dept: [phone] Loc: [place]  08/30/2018  You are scheduled for a Peripheral Angiogram on Monday, July 27 with Dr. Quay Burow.  1. Please arrive at the [facility] (Main Entrance A) at [facility]: [address] [place], [place] [address] at 5:30 AM (This time is two hours before your procedure to ensure your preparation). Free valet parking service is available.   Special note: Every effort is made to have your procedure done on time. Please understand that emergencies sometimes delay scheduled procedures.  2. Diet: Do not eat solid foods after midnight.  The patient may have clear liquids until 5am upon the day of the procedure.  3. Labs:  You will need to have blood drawn on August 31, 2018 You will need to have a COVID-19 test done on August 31, 2018  Go to:  Retail buyer at [employer] [id number], [place], [place] [address] FOR YOUR BASIC METABOLIC PANEL, COMPLETE BLOOD COUNT, AND THYROID STIMULATING HORMONE LAB WORK. NO APPOINTMENT IS NEEDED. YOU MUST HAVE THIS LAB WORK DONE BEFORE GOING TO GET YOUR COVID-19 TEST DONE BECAUSE YOU WILL NEED TO QUARANTINE YOURSELF AFTER THE COVID-19 TEST UNTIL THE DAY OF YOUR [place].  Go to: [facility]  [address] [place], [place] [address] FOR YOUR COVID-19 TEST. YOU MUST HAVE YOUR COVID-19 TEST COMPLETED 3 DAYS PRIOR TO YOUR UPCOMING PROCEDURE/TEST. YOU WILL NEED AN APPOINTMENT. YOUR APPOINTMENT IS SCHEDULED FOR 08/31/2018 AT-----. YOU WILL ALSO NEED TO QUARANTINE YOURSELF AFTER THE COVID-19 TEST UNTIL THE DAY OF YOUR PROCEDURE/TEST. RESULTS WILL  BE POSTED IN OUR SYSTEM IN 48 HOURS OR LESS.   4. Medication instructions in preparation for your procedure:   On the morning of your procedure, take your  Aspirin and any morning medicines NOT listed above.  You may use sips of water.  5. Plan for one night stay--bring personal belongings. 6. Bring a current list of your medications and current insurance cards. 7. You MUST have a responsible person to drive you home. 8. Someone MUST be with you the first 24 hours after you arrive home or your discharge will be delayed. 9. Please wear clothes that are easy to get on and off and wear slip-on shoes.   Testing/Procedures: Your physician has requested that you have an aorta/iliac duplex. During this test, an ultrasound is used to evaluate blood flow to the aorta and iliac arteries. Allow one hour for this exam. Do not eat after midnight the day before and avoid carbonated beverages. TO  BE SCHEDULED FOR APPROXIMATELY 1 WEEK AFTER YOUR PROCEDURE  Your physician has requested that you have an ankle brachial index (ABI). During this test an ultrasound and blood pressure cuff are used to evaluate the arteries that supply the arms and legs with blood. Allow thirty minutes for this exam. There are no restrictions or special instructions. TO  BE SCHEDULED FOR APPROXIMATELY 1 WEEK AFTER YOUR PROCEDURE  Follow-Up: At [facility], you and your health needs are our priority.  As part of our continuing mission to provide you with exceptional heart care, we have created designated Provider Care Teams.  These Care Teams include your primary Cardiologist (physician) and Advanced Practice Providers (APPs -  Physician Assistants and Nurse Practitioners) who all work together to provide you with the care you need, when  you need it. You will need a follow up appointment WITH DR. Gwenlyn Found APPROXIMATELY 2-3 WEEKS AFTER YOUR PROCEDURE    Thank you for allowing [place] to care for you!   -- [place]

## 2018-08-30 ENCOUNTER — Telehealth: Payer: Self-pay

## 2018-08-30 NOTE — Telephone Encounter (Signed)
Contacted pt to review AVS for upcoming PV procedure 7/27. Pt stated that he would like to postpone his procedure for 6 months. Informed pt that nurse would make Dr. Gwenlyn Found aware of this.  Encounter routed to Dr. Gwenlyn Found   Recall for 6 month f/u in Epic

## 2018-08-30 NOTE — Addendum Note (Signed)
Addended by: Annita Brod on: 08/30/2018 05:35 PM   Modules accepted: Orders

## 2018-08-31 NOTE — Telephone Encounter (Signed)
Thx

## 2018-09-04 ENCOUNTER — Ambulatory Visit (HOSPITAL_COMMUNITY): Admit: 2018-09-04 | Payer: Medicare HMO | Admitting: Cardiovascular Disease

## 2018-09-04 ENCOUNTER — Encounter (HOSPITAL_COMMUNITY): Payer: Self-pay

## 2018-09-04 SURGERY — ABDOMINAL AORTOGRAM W/LOWER EXTREMITY
Anesthesia: LOCAL | Laterality: Bilateral

## 2018-09-13 ENCOUNTER — Ambulatory Visit: Payer: Medicare HMO | Admitting: Podiatry

## 2018-09-25 ENCOUNTER — Other Ambulatory Visit: Payer: Self-pay | Admitting: Internal Medicine

## 2018-09-25 DIAGNOSIS — I10 Essential (primary) hypertension: Secondary | ICD-10-CM

## 2018-09-25 NOTE — Telephone Encounter (Signed)
Refill approved.

## 2018-10-04 ENCOUNTER — Ambulatory Visit: Payer: Medicare HMO | Admitting: Podiatry

## 2018-12-01 DIAGNOSIS — H40013 Open angle with borderline findings, low risk, bilateral: Secondary | ICD-10-CM | POA: Diagnosis not present

## 2018-12-01 DIAGNOSIS — H2513 Age-related nuclear cataract, bilateral: Secondary | ICD-10-CM | POA: Diagnosis not present

## 2018-12-21 ENCOUNTER — Other Ambulatory Visit: Payer: Self-pay | Admitting: *Deleted

## 2018-12-21 DIAGNOSIS — N401 Enlarged prostate with lower urinary tract symptoms: Secondary | ICD-10-CM

## 2018-12-21 MED ORDER — TAMSULOSIN HCL 0.4 MG PO CAPS: 0.4000 mg | ORAL_CAPSULE | Freq: Every day | ORAL | 2 refills | Status: DC

## 2018-12-21 NOTE — Telephone Encounter (Signed)
Refill approved.

## 2019-01-18 ENCOUNTER — Other Ambulatory Visit: Payer: Self-pay

## 2019-01-18 ENCOUNTER — Encounter: Payer: Self-pay | Admitting: Internal Medicine

## 2019-01-18 ENCOUNTER — Ambulatory Visit (INDEPENDENT_AMBULATORY_CARE_PROVIDER_SITE_OTHER): Payer: Medicare HMO | Admitting: Internal Medicine

## 2019-01-18 VITALS — BP 156/91 | HR 83 | Temp 98.2°F | Ht 71.0 in | Wt 153.8 lb

## 2019-01-18 DIAGNOSIS — I739 Peripheral vascular disease, unspecified: Secondary | ICD-10-CM | POA: Diagnosis not present

## 2019-01-18 DIAGNOSIS — E210 Primary hyperparathyroidism: Secondary | ICD-10-CM

## 2019-01-18 DIAGNOSIS — Z79899 Other long term (current) drug therapy: Secondary | ICD-10-CM | POA: Diagnosis not present

## 2019-01-18 DIAGNOSIS — Z23 Encounter for immunization: Secondary | ICD-10-CM

## 2019-01-18 MED ORDER — ATORVASTATIN CALCIUM 20 MG PO TABS: 20.0000 mg | ORAL_TABLET | Freq: Every day | ORAL | 1 refills | Status: DC

## 2019-01-18 NOTE — Progress Notes (Signed)
   CC: Primary hyperparathyroidism, PAD  HPI:  [name] is a 73 y.o. year-old male with PMH listed below who presents to clinic for primary hyperparathyroidism, PAD. Please see problem based assessment and plan for further details.   Past Medical History:  Diagnosis Date  . Arthritis   . Benign prostatic hyperplasia without lower urinary tract symptoms 06/15/2016  . BPH (benign prostatic hyperplasia)   . Cramp in limb 02/09/2018  . Edema of right lower extremity 06/15/2016  . Glaucoma   . Hypertension   . Hypokalemia 11/03/2017  . Macrocytosis without anemia 01/27/2017  . Pain of right lower extremity 02/09/2018  . Primary hyperparathyroidism ([place]) 01/27/2017   Diagnosed March 2019. - PTH 108, Ca 11 (<1 over ULN), eGFR 85 (>60)  - DEXA: T: -1.1 (ostepenia not indication for surgery) - No bone pain, renal stones, constipation, or nausea - 24-hr urinary calcium not done - Supplementing Vit D   . Tachycardia 01/19/2017  . Tobacco abuse 01/26/2017  . Vision blurred 04/14/2017   Review of Systems:  Review of Systems  Constitutional: Negative for chills, fever and malaise/fatigue.  Respiratory: Negative for shortness of breath.   Cardiovascular: Negative for chest pain, palpitations and leg swelling.  Gastrointestinal: Negative for abdominal pain, constipation, diarrhea, nausea and vomiting.  Neurological: Negative for dizziness.    Physical Exam:  Vitals:   01/18/19 1458  BP: (!) 152/96  Pulse: 89  Temp: 98.2 F (36.8 C)  TempSrc: Oral  SpO2: 100%  Weight: 153 lb 12.8 oz (69.8 kg)  Height: _0  (1.803 m)    General: well appearing elderly male in NAD  Cardiac: regular rate and rhythm, nl S1/S2, no murmurs, rubs or gallops Pulm: CTAB, no wheezes or crackles, no increased work of breathing on room air  Ext: warm and well perfused, no peripheral edema   Assessment & Plan:   See Encounters Tab for problem based charting.  Patient discussed with Dr. Dareen Piano

## 2019-01-18 NOTE — Patient Instructions (Addendum)
Bryan Vincent,   To decrease your risk of heart disease we will start a new medication called atorvastatin or Lipitor.  You will take 1 tablet every day.  This medication can cause muscle aches but this usually resolves within 2 weeks.  We will check your calcium levels today as recommended by your regular doctor.  Please make a follow-up appointment with your regular doctor in about 6 months.  Call if you have any questions or concerns.   - Dr. Frederico Hamman

## 2019-01-19 ENCOUNTER — Encounter: Payer: Self-pay | Admitting: Internal Medicine

## 2019-01-19 LAB — CMP14 + ANION GAP
ALT: 14 IU/L (ref 0–44)
AST: 23 IU/L (ref 0–40)
Albumin/Globulin Ratio: 1.6 (ref 1.2–2.2)
Albumin: 4.2 g/dL (ref 3.7–4.7)
Alkaline Phosphatase: 88 IU/L (ref 39–117)
Anion Gap: 17 mmol/L (ref 10.0–18.0)
BUN/Creatinine Ratio: 8 — ABNORMAL LOW (ref 10–24)
BUN: 10 mg/dL (ref 8–27)
Bilirubin Total: 3 mg/dL — ABNORMAL HIGH (ref 0.0–1.2)
CO2: 21 mmol/L (ref 20–29)
Calcium: 11.1 mg/dL — ABNORMAL HIGH (ref 8.6–10.2)
Chloride: 101 mmol/L (ref 96–106)
Creatinine, Ser: 1.24 mg/dL (ref 0.76–1.27)
GFR calc Af Amer: 67 mL/min/{1.73_m2} (ref >59)
GFR calc non Af Amer: 58 mL/min/{1.73_m2} — ABNORMAL LOW (ref >59)
Globulin, Total: 2.7 g/dL (ref 1.5–4.5)
Glucose: 94 mg/dL (ref 65–99)
Potassium: 3.2 mmol/L — ABNORMAL LOW (ref 3.5–5.2)
Sodium: 139 mmol/L (ref 134–144)
Total Protein: 6.9 g/dL (ref 6.0–8.5)

## 2019-01-19 NOTE — Assessment & Plan Note (Signed)
Patient has a history of claudication with abnormal ABIs.  He was referred to Dr. Gwenlyn Found for further evaluation and recommended angiography.  However, patient declined.  He reports today claudication symptoms are resolved and he is still unsure of whether he wants to go forward with angiography or not.  Started moderate intensity atorvastatin 20 mg QD.

## 2019-01-19 NOTE — Assessment & Plan Note (Signed)
Patient remains asymptomatic.  Will order CMP check calcium levels per PCPs last note.  Review of chart it seems like PCP has discussed elective surgery for this.  However, patient does not know if he would like to have surgery at this time.

## 2019-01-22 NOTE — Progress Notes (Signed)
Internal Medicine Clinic Attending  Case discussed with Dr. Santos-Sanchez at the time of the visit.  We reviewed the resident's history and exam and pertinent patient test results.  I agree with the assessment, diagnosis, and plan of care documented in the resident's note.    

## 2019-01-29 ENCOUNTER — Other Ambulatory Visit: Payer: Self-pay | Admitting: Internal Medicine

## 2019-01-29 DIAGNOSIS — I10 Essential (primary) hypertension: Secondary | ICD-10-CM

## 2019-02-05 ENCOUNTER — Telehealth: Payer: Self-pay | Admitting: Internal Medicine

## 2019-02-05 NOTE — Telephone Encounter (Signed)
Per pt [employer] is out of Amlodipine   ;pt contact [phone] they are requesting a replace be sent it

## 2019-02-05 NOTE — Telephone Encounter (Signed)
RX for Amlodipine was sent to [facility] today via [place]. SChaplin, RN,BSN

## 2019-02-14 ENCOUNTER — Other Ambulatory Visit: Payer: Self-pay | Admitting: Internal Medicine

## 2019-02-14 DIAGNOSIS — I10 Essential (primary) hypertension: Secondary | ICD-10-CM

## 2019-02-15 NOTE — Telephone Encounter (Signed)
Next appt scheduled  07/19/19 with PCP.

## 2019-04-02 ENCOUNTER — Other Ambulatory Visit: Payer: Self-pay | Admitting: Internal Medicine

## 2019-04-02 DIAGNOSIS — I10 Essential (primary) hypertension: Secondary | ICD-10-CM

## 2019-04-03 ENCOUNTER — Encounter: Payer: Self-pay | Admitting: Cardiology

## 2019-04-03 ENCOUNTER — Telehealth (INDEPENDENT_AMBULATORY_CARE_PROVIDER_SITE_OTHER): Payer: Medicare HMO | Admitting: Cardiology

## 2019-04-03 VITALS — Ht 71.0 in

## 2019-04-03 DIAGNOSIS — Z720 Tobacco use: Secondary | ICD-10-CM

## 2019-04-03 DIAGNOSIS — I739 Peripheral vascular disease, unspecified: Secondary | ICD-10-CM | POA: Diagnosis not present

## 2019-04-03 DIAGNOSIS — I10 Essential (primary) hypertension: Secondary | ICD-10-CM | POA: Diagnosis not present
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
## 2019-04-03 NOTE — Patient Instructions (Signed)
Medication Instructions:  Your physician recommends that you continue on your current medications as directed. Please refer to the Current Medication list given to you today.  *If you need a refill on your cardiac medications before your next appointment, please call your pharmacy*   Follow-Up: At [facility], you and your health needs are our priority.  As part of our continuing mission to provide you with exceptional heart care, we have created designated Provider Care Teams.  These Care Teams include your primary Cardiologist (physician) and Advanced Practice Providers (APPs -  Physician Assistants and Nurse Practitioners) who all work together to provide you with the care you need, when you need it.  Your next appointment:    Please follow up with your Primary Care Provider in 2 weeks.   Follow-up with Cardiology as needed.  Other Instructions Your physician has requested that you regularly monitor and record your blood pressure readings at home. Please use the same machine at the same time of day to check your readings and record them to bring to your follow-up visit with your primary provider.

## 2019-04-04 DIAGNOSIS — H2513 Age-related nuclear cataract, bilateral: Secondary | ICD-10-CM | POA: Diagnosis not present

## 2019-04-04 DIAGNOSIS — H401133 Primary open-angle glaucoma, bilateral, severe stage: Secondary | ICD-10-CM | POA: Diagnosis not present

## 2019-04-05 ENCOUNTER — Other Ambulatory Visit: Payer: Self-pay | Admitting: Internal Medicine

## 2019-04-05 NOTE — Telephone Encounter (Signed)
Need refill on amLODipine (NORVASC) 10 MG tablet  ;pt contact [place], [place]

## 2019-04-05 NOTE — Telephone Encounter (Signed)
Refill approved.

## 2019-04-05 NOTE — Telephone Encounter (Signed)
Filled today and sent to pharmacy

## 2019-04-18 ENCOUNTER — Telehealth: Payer: Self-pay

## 2019-04-18 NOTE — Telephone Encounter (Signed)
amLODipine (NORVASC) 10 MG tablet   finasteride (PROSCAR) 5 MG tablet   tamsulosin (FLOMAX) 0.4 MG CAPS capsule  atorvastatin (LIPITOR) 20 MG tablet, refill request @  [id number] - [place], [place] - [address] [phone] (Phone) [phone] (Fax)

## 2019-04-18 NOTE — Telephone Encounter (Signed)
RTC to patient, per pt's med list, he should have refills on amlodipine and tamsulosin through [facility].  Pt states it is taking too long to get the refills from [facility] and is [facility]. Pt also should have refills on [facility]vastatin [facility], but states he does not. Pt also requesting refill on finasteride.  Pt has an upcoming appt with PCP tomorrow at 1:15, instructed to bring all medication bottles to appt for medication management, he verbalized understanding. SChaplin, RN,BSN

## 2019-04-19 ENCOUNTER — Other Ambulatory Visit: Payer: Self-pay

## 2019-04-19 ENCOUNTER — Ambulatory Visit (INDEPENDENT_AMBULATORY_CARE_PROVIDER_SITE_OTHER): Payer: Medicare HMO | Admitting: Internal Medicine

## 2019-04-19 ENCOUNTER — Encounter: Payer: Self-pay | Admitting: Internal Medicine

## 2019-04-19 VITALS — BP 128/70 | HR 63 | Temp 98.3°F | Ht 71.0 in | Wt 160.2 lb

## 2019-04-19 DIAGNOSIS — Z720 Tobacco use: Secondary | ICD-10-CM | POA: Diagnosis not present

## 2019-04-19 DIAGNOSIS — N400 Enlarged prostate without lower urinary tract symptoms: Secondary | ICD-10-CM | POA: Diagnosis not present

## 2019-04-19 DIAGNOSIS — E876 Hypokalemia: Secondary | ICD-10-CM

## 2019-04-19 DIAGNOSIS — I739 Peripheral vascular disease, unspecified: Secondary | ICD-10-CM | POA: Diagnosis not present

## 2019-04-19 DIAGNOSIS — D7589 Other specified diseases of blood and blood-forming organs: Secondary | ICD-10-CM

## 2019-04-19 DIAGNOSIS — D696 Thrombocytopenia, unspecified: Secondary | ICD-10-CM | POA: Diagnosis not present

## 2019-04-19 DIAGNOSIS — E210 Primary hyperparathyroidism: Secondary | ICD-10-CM | POA: Diagnosis not present

## 2019-04-19 DIAGNOSIS — Z23 Encounter for immunization: Secondary | ICD-10-CM

## 2019-04-19 DIAGNOSIS — I10 Essential (primary) hypertension: Secondary | ICD-10-CM

## 2019-04-19 DIAGNOSIS — M79604 Pain in right leg: Secondary | ICD-10-CM

## 2019-04-19 DIAGNOSIS — Z79899 Other long term (current) drug therapy: Secondary | ICD-10-CM | POA: Diagnosis not present

## 2019-04-19 DIAGNOSIS — N401 Enlarged prostate with lower urinary tract symptoms: Secondary | ICD-10-CM

## 2019-04-19 DIAGNOSIS — Z0000 Encounter for general adult medical examination without abnormal findings: Secondary | ICD-10-CM

## 2019-04-19 MED ORDER — VALSARTAN 160 MG PO TABS: 320.0000 mg | ORAL_TABLET | Freq: Every day | ORAL | 3 refills | Status: DC

## 2019-04-19 MED ORDER — AMLODIPINE BESYLATE 10 MG PO TABS: 10.0000 mg | ORAL_TABLET | Freq: Every day | ORAL | 2 refills | Status: DC

## 2019-04-19 MED ORDER — ATORVASTATIN CALCIUM 20 MG PO TABS: 20.0000 mg | ORAL_TABLET | Freq: Every day | ORAL | 1 refills | Status: DC

## 2019-04-19 MED ORDER — FINASTERIDE 5 MG PO TABS: 5.0000 mg | ORAL_TABLET | Freq: Every day | ORAL | 3 refills | Status: DC

## 2019-04-19 MED ORDER — TAMSULOSIN HCL 0.4 MG PO CAPS: 0.4000 mg | ORAL_CAPSULE | Freq: Every day | ORAL | 2 refills | Status: DC

## 2019-04-19 MED ORDER — CARVEDILOL 6.25 MG PO TABS: 6.2500 mg | ORAL_TABLET | Freq: Two times a day (BID) | ORAL | 1 refills | Status: DC

## 2019-04-19 NOTE — Progress Notes (Signed)
Internal Medicine Clinic Attending  Case discussed with Dr. Melvin at the time of the visit.  We reviewed the resident's history and exam and pertinent patient test results.  I agree with the assessment, diagnosis, and plan of care documented in the resident's note.  Lene Mckay, M.D., Ph.D.  

## 2019-04-19 NOTE — Telephone Encounter (Signed)
Patient seen at appointment today. Refills provided and [facility] to [facility]

## 2019-04-19 NOTE — Assessment & Plan Note (Addendum)
Bryan Vincent remains asymptomatic (no bone pain, renal stone, nausea, or constipation). His last Ca was not >1 over the upper limit over normal and his albumin was >4 which corrected the Ca lower. Will continue to monitor. Repeat DEXA due by this may, will place order for this. Last labs were in December. - DEXA, repeat ~q2year - CMP today as we are following hypokalemia as well, yearly monitoring of Ca - Patient unsure at this time if he would proceed with elective surgery if disease progresses  ADDENDUM: Ca stable at 10.8

## 2019-04-19 NOTE — Assessment & Plan Note (Addendum)
He has continue to do well on Finasteride and Tamsulosin. He is requesting refills [facility] as the refills from [facility] have be delayed. - Finasteride 5mg  Daily - Tamsulosin 0.4mg  Daily

## 2019-04-19 NOTE — Assessment & Plan Note (Addendum)
Patient remains persistently Hypokalemic with K 3.2 at his last visit. We will recheck K (with CMP given primary hyperpartethyroidism) and Mg today and plan to begin repletion if low with recheck next week. Also may consider spironolactone for BP control and hypokalemia. - CMP and Mg  ADDENDUM: K and Mg WNL

## 2019-04-19 NOTE — Patient Instructions (Addendum)
Thank you for allowing [place] to care for you  We are [facility]ill [facility]  We are checking labs to look at your Potassium, Calcium, Magnesium, and blood count  I have ordered a DEXA (Done scan) to be done for you   Follow up with Dr. Gwenlyn Found for your blood flow evaluation  Pneumonia vaccine given today, wait 2 weeks before getting COVID vaccine  Continue to work on stopping smoking

## 2019-04-19 NOTE — Assessment & Plan Note (Addendum)
BP today is 128/70. He remains well controlled on current medications. We may, however, consider switching to Spironolactone for possible advantageous side effect of raising his K as it has been persistent low. Continue current regimen for now. -Amlodipine10mg  Daily -Valsartan 320mg  Daily - Carvedilol 6.25 BID

## 2019-04-19 NOTE — Assessment & Plan Note (Signed)
Counseled on smoking cessation today. He has the number for the quit line and his significant other quit 10 years ago. We discussed how this can affect his blood flow long term in addition to other long term effects. He wants to continue slowly cutting back on his own for now.

## 2019-04-19 NOTE — Assessment & Plan Note (Addendum)
-   Pneumonia PPSV23 given - CBC, CMP today

## 2019-04-19 NOTE — Addendum Note (Signed)
Addended by: Sander Nephew F on: 04/19/2019 04:21 PM   Modules accepted: Orders

## 2019-04-19 NOTE — Assessment & Plan Note (Addendum)
Patient's claudication symptoms are still present. We discussed angiography recommended by Dr. Gwenlyn Found. Patient states he had concern about the procedure and if he would have some numbing. I told him he would not be put to sleep but the sites would likely be numbed and that he can speak with Dr. Gwenlyn Found about the details again if needed. - He is to follow up with Dr. Gwenlyn Found for angiography, he is willing to do this at this time. - We discussed the importance of smoking cessation - Continue Statin  ------------------------------------------------------------------------------------ Patient Chart Contained Duplicate Diagnosis, Previous notes below for completeness.  Office Visit (01/18/2019) Welford Roche, MD "Patient has a history of claudication with abnormal ABIs.  He was referred to Dr. Gwenlyn Found for further evaluation and recommended angiography.  However, patient declined.  He reports today claudication symptoms are resolved and he is still unsure of whether he wants to go forward with angiography or not.  Started moderate intensity atorvastatin 20 mg QD."  Office Visit (08/24/2018) - Lorretta Harp, MD "Mr. Wells referred to me by Dr. Trilby Drummer for evaluation of claudication.  He has had right calf claudication for 2 years which is lifestyle limiting.  He had Dopplers performed in our office 07/24/2018 revealing a right ABI of 0.75 and a left of 0.67.  He had a high-frequency signal of the proximal portion of his right SFA and moderate left SFA stenosis.  We will plan peripheral angiography and endovascular therapy in the next several weeks."

## 2019-04-19 NOTE — Assessment & Plan Note (Deleted)
Patient's claudication symptoms are -----. We discussed angiography recommended by Dr. Gwenlyn Found. Patient states ------- -  -

## 2019-04-19 NOTE — Assessment & Plan Note (Addendum)
No CBC in 2 years, will recheck today, previous Fe, Folate, B12, LDH, and Haptoglobin WNL. - CBC  ADDENDUM: Mild Macrocytosis stable. Mild thrombocytopenia noted. He only has only prior PLT count of 160 in our system. Patient will have his CBC rechecked at follow up and work up may need to be done if PLT count is persistently low. Discussed with patient by phone.

## 2019-04-19 NOTE — Progress Notes (Signed)
   CC: Hypertension, BPH, Primary hyperparathyroidism, Claudication, Hypokalemia, Tobacco Use, Macrocytosis, Preventative Healthcare  HPI:  Mr.Marc Dammon Makarewicz is a 74 y.o. M with PMHx listed below presenting for Hypertension, BPH, Primary hyperparathyroidism, Claudication, Hypokalemia, Tobacco Use, Macrocytosis, Preventative Healthcare . Please see the A&P for the status of the patient's chronic medical problems.  Past Medical History:  Diagnosis Date  . Arthritis   . Benign prostatic hyperplasia without lower urinary tract symptoms 06/15/2016  . BPH (benign prostatic hyperplasia)   . Cramp in limb 02/09/2018  . Edema of right lower extremity 06/15/2016  . Edema of right lower extremity 06/15/2016  . Glaucoma   . Hypertension   . Hypokalemia 11/03/2017  . Macrocytosis without anemia 01/27/2017  . Pain of right lower extremity 02/09/2018  . Primary hyperparathyroidism ([place]) 01/27/2017   Diagnosed March 2019. - PTH 108, Ca 11 (<1 over ULN), eGFR 85 (>60)  - DEXA: T: -1.1 (ostepenia not indication for surgery) - No bone pain, renal stones, constipation, or nausea - 24-hr urinary calcium not done - Supplementing Vit D   . Tachycardia 01/19/2017  . Tobacco abuse 01/26/2017  . Vision blurred 04/14/2017   Review of Systems:  Performed and all others negative.  Physical Exam:  Vitals:   04/19/19 1330  BP: 128/70  Pulse: 63  Temp: 98.3 F (36.8 C)  TempSrc: Oral  SpO2: 99%  Weight: 160 lb 3.2 oz (72.7 kg)  Height: '5\' 11"'$  (1.803 m)   Physical Exam Constitutional:      General: He is not in acute distress.    Appearance: Normal appearance.  Cardiovascular:     Rate and Rhythm: Normal rate and regular rhythm.     Pulses: Normal pulses.     Heart sounds: Normal heart sounds.  Pulmonary:     Effort: Pulmonary effort is normal. No respiratory distress.     Breath sounds: Normal breath sounds.  Abdominal:     General: Bowel sounds are normal. There is no distension.     Palpations:  Abdomen is soft.     Tenderness: There is no abdominal tenderness.  Musculoskeletal:        General: No swelling or deformity.  Skin:    General: Skin is warm and dry.  Neurological:     General: No focal deficit present.     Mental Status: Mental status is at baseline.    Assessment & Plan:   See Encounters Tab for problem based charting.  Patient discussed with Dr. Rebeca Alert

## 2019-04-20 LAB — CBC WITH DIFFERENTIAL/PLATELET
Basophils Absolute: 0.1 10*3/uL (ref 0.0–0.2)
Basos: 1 %
EOS (ABSOLUTE): 0.5 10*3/uL — ABNORMAL HIGH (ref 0.0–0.4)
Eos: 8 %
Hematocrit: 40.2 % (ref 37.5–51.0)
Hemoglobin: 13.7 g/dL (ref 13.0–17.7)
Immature Grans (Abs): 0 10*3/uL (ref 0.0–0.1)
Immature Granulocytes: 0 %
Lymphocytes Absolute: 2.8 10*3/uL (ref 0.7–3.1)
Lymphs: 47 %
MCH: 35.4 pg — ABNORMAL HIGH (ref 26.6–33.0)
MCHC: 34.1 g/dL (ref 31.5–35.7)
MCV: 104 fL — ABNORMAL HIGH (ref 79–97)
Monocytes Absolute: 0.5 10*3/uL (ref 0.1–0.9)
Monocytes: 8 %
Neutrophils Absolute: 2.2 10*3/uL (ref 1.4–7.0)
Neutrophils: 36 %
Platelets: 127 10*3/uL — ABNORMAL LOW (ref 150–450)
RBC: 3.87 x10E6/uL — ABNORMAL LOW (ref 4.14–5.80)
RDW: 11.8 % (ref 11.6–15.4)
WBC: 6.1 10*3/uL (ref 3.4–10.8)

## 2019-04-20 LAB — CMP14 + ANION GAP
ALT: 17 IU/L (ref 0–44)
AST: 23 IU/L (ref 0–40)
Albumin/Globulin Ratio: 1.9 (ref 1.2–2.2)
Albumin: 4.4 g/dL (ref 3.7–4.7)
Alkaline Phosphatase: 91 IU/L (ref 39–117)
Anion Gap: 16 mmol/L (ref 10.0–18.0)
BUN/Creatinine Ratio: 10 (ref 10–24)
BUN: 13 mg/dL (ref 8–27)
Bilirubin Total: 0.9 mg/dL (ref 0.0–1.2)
CO2: 18 mmol/L — ABNORMAL LOW (ref 20–29)
Calcium: 10.8 mg/dL — ABNORMAL HIGH (ref 8.6–10.2)
Chloride: 104 mmol/L (ref 96–106)
Creatinine, Ser: 1.24 mg/dL (ref 0.76–1.27)
GFR calc Af Amer: 67 mL/min/{1.73_m2} (ref >59)
GFR calc non Af Amer: 58 mL/min/{1.73_m2} — ABNORMAL LOW (ref >59)
Globulin, Total: 2.3 g/dL (ref 1.5–4.5)
Glucose: 72 mg/dL (ref 65–99)
Potassium: 4.1 mmol/L (ref 3.5–5.2)
Sodium: 138 mmol/L (ref 134–144)
Total Protein: 6.7 g/dL (ref 6.0–8.5)

## 2019-04-20 LAB — MAGNESIUM: Magnesium: 1.7 mg/dL (ref 1.6–2.3)

## 2019-05-17 ENCOUNTER — Ambulatory Visit: Payer: Medicare HMO | Attending: Internal Medicine

## 2019-05-17 DIAGNOSIS — Z23 Encounter for immunization: Secondary | ICD-10-CM

## 2019-05-17 NOTE — Progress Notes (Signed)
   Covid-19 Vaccination Clinic  Name:  Bryan Vincent    MRN: [id number] DOB: 1945-06-29  05/17/2019  [name] was observed post Covid-19 immunization for 15 minutes without incident. He was provided with Vaccine Information Sheet and instruction to access the V-Safe system.   [name] was instructed to call 911 with any severe reactions post vaccine: Marland Kitchen Difficulty breathing  . Swelling of face and throat  . A fast heartbeat  . A bad rash all over body  . Dizziness and weakness   Immunizations Administered    Name Date Dose VIS Date Route   Pfizer COVID-19 Vaccine 05/17/2019  8:58 AM 0.3 mL 01/19/2019 Intramuscular   Manufacturer: [place]   Lot: [id number]   [place]: [id number]

## 2019-06-08 ENCOUNTER — Encounter: Payer: Self-pay | Admitting: *Deleted

## 2019-06-12 ENCOUNTER — Ambulatory Visit: Payer: Medicare HMO | Attending: Internal Medicine

## 2019-06-12 DIAGNOSIS — Z23 Encounter for immunization: Secondary | ICD-10-CM

## 2019-06-12 NOTE — Progress Notes (Signed)
   Covid-19 Vaccination Clinic  Name:  Bryan Vincent    MRN: [id number] DOB: [birth year]  06/12/2019  [name] was observed post Covid-19 immunization for 15 minutes without incident. He was provided with Vaccine Information Sheet and instruction to access the V-Safe system.   [name] was instructed to call 911 with any severe reactions post vaccine: Marland Kitchen Difficulty breathing  . Swelling of face and throat  . A fast heartbeat  . A bad rash all over body  . Dizziness and weakness   Immunizations Administered    Name Date Dose VIS Date Route   Pfizer COVID-19 Vaccine 06/12/2019 10:11 AM 0.3 mL 04/04/2018 Intramuscular   Manufacturer: [place]   Lot: [id number]   [place]: [id number]

## 2019-07-12 ENCOUNTER — Ambulatory Visit
Admission: RE | Admit: 2019-07-12 | Discharge: 2019-07-12 | Disposition: A | Discharge status: Home / Self Care | Payer: Medicare HMO | Source: Ambulatory Visit | Attending: Internal Medicine | Admitting: Internal Medicine

## 2019-07-12 ENCOUNTER — Other Ambulatory Visit: Payer: Self-pay

## 2019-07-12 DIAGNOSIS — E210 Primary hyperparathyroidism: Secondary | ICD-10-CM

## 2019-07-13 ENCOUNTER — Encounter: Payer: Self-pay | Admitting: Podiatry

## 2019-07-13 ENCOUNTER — Ambulatory Visit: Payer: Medicare HMO | Admitting: Podiatry

## 2019-07-13 VITALS — Temp 97.4°F

## 2019-07-13 DIAGNOSIS — L84 Corns and callosities: Secondary | ICD-10-CM | POA: Diagnosis not present

## 2019-07-13 DIAGNOSIS — G629 Polyneuropathy, unspecified: Secondary | ICD-10-CM

## 2019-07-13 DIAGNOSIS — B351 Tinea unguium: Secondary | ICD-10-CM | POA: Diagnosis not present

## 2019-07-13 DIAGNOSIS — M79676 Pain in unspecified toe(s): Secondary | ICD-10-CM

## 2019-07-13 NOTE — Patient Instructions (Signed)

## 2019-07-13 NOTE — Progress Notes (Addendum)
Subjective: Bryan Vincent is a 74 y.o. male patient seen today painful mycotic nails b/l that are difficult to trim. Pain interferes with ambulation. Aggravating factors include wearing enclosed shoe gear. Pain is relieved with periodic professional debridement.  Patient Active Problem List   Diagnosis Date Noted  . 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 Cramp in limb 02/09/2018  . Preventative health care 02/09/2018  . Hypokalemia 11/03/2017  . Vision blurred 04/14/2017  . Primary hyperparathyroidism ([place]) 01/27/2017  . Macrocytosis without anemia 01/27/2017  . Tobacco abuse 01/26/2017  . Tachycardia 01/19/2017  . Essential hypertension 06/15/2016  . Benign prostatic hyperplasia without lower urinary tract symptoms 06/15/2016    Current Outpatient Medications on File Prior to Visit  Medication Sig Dispense Refill  . amLODipine (NORVASC) 10 MG tablet Take 1 tablet (10 mg total) by mouth daily. 90 tablet 2  . atorvastatin (LIPITOR) 20 MG tablet Take 1 tablet (20 mg total) by mouth daily. 90 tablet 1  . carvedilol (COREG) 6.25 MG tablet Take 1 tablet (6.25 mg total) by mouth 2 (two) times daily with a meal. 180 tablet 1  . Cholecalciferol (VITAMIN D) 50 MCG (2000 UT) tablet TAKE 1 TABLET BY MOUTH EVERY DAY 90 tablet 1  . COMBIGAN 0.2-0.5 % ophthalmic solution     . finasteride (PROSCAR) 5 MG tablet Take 1 tablet (5 mg total) by mouth daily. 90 tablet 3  . ibuprofen (ADVIL,MOTRIN) 800 MG tablet Take 800 mg by mouth 3 (three) times daily.  12  . LUMIGAN 0.01 % SOLN     . tamsulosin (FLOMAX) 0.4 MG CAPS capsule Take 1 capsule (0.4 mg total) by mouth daily. 90 capsule 2  . valsartan (DIOVAN) 160 MG tablet Take 2 tablets (320 mg total) by mouth daily. 180 tablet 3  . Vitamin D, Ergocalciferol, (DRISDOL) 50000 units CAPS capsule Take 1 capsule (50,000 Units total) by mouth every 7 (seven) days. 8 capsule 0   No current facility-administered medications on file  prior to visit.    No Known Allergies  Objective: Physical Exam  General: Bryan Vincent is a pleasant 74 y.o. African American male, in NAD. AAO x 3.   Vascular:  Neurovascular status unchanged b/l lower extremities. Capillary fill time to digits <3 seconds b/l lower extremities. Palpable DP pulses b/l. Palpable PT pulses b/l. Skin temperature gradient within normal limits b/l. No edema noted b/l.  Dermatological:  Pedal skin with normal turgor, texture and tone bilaterally. No open wounds bilaterally. No interdigital macerations bilaterally. Toenails 1-5 b/l elongated, discolored, dystrophic, thickened, crumbly with subungual debris and tenderness to dorsal palpation. Hyperkeratotic lesion(s) L hallux and R hallux.  No erythema, no edema, no drainage, no flocculence.  Musculoskeletal:  Normal muscle strength 5/5 to all lower extremity muscle groups bilaterally. No pain crepitus or joint limitation noted with ROM b/l. No gross bony deformities bilaterally. Hallux valgus with bunion deformity noted b/l lower extremities. Hammertoes noted to the 2-5 bilaterally.  Neurological:  Protective sensation diminished with 10g monofilament b/l.  Assessment and Plan:  1. Pain due to onychomycosis of toenail   2. Callus   3. Neuropathy    -Examined patient. -No new findings. No new orders. -Toenails 1-5 b/l were debrided in length and girth with sterile nail nippers and dremel without iatrogenic bleeding.  -Callus(es) L hallux and R hallux pared utilizing sterile scalpel blade without complication or incident. Total number debrided =2. -Patient to continue soft, supportive shoe gear daily. -Patient to report  any pedal injuries to medical professional immediately. -Patient/POA to call should there be question/concern in the interim.  Return in about 3 months (around 10/13/2019).  Marzetta Board, DPM

## 2019-07-19 ENCOUNTER — Encounter: Payer: Medicare HMO | Admitting: Internal Medicine

## 2019-08-10 DIAGNOSIS — H401133 Primary open-angle glaucoma, bilateral, severe stage: Secondary | ICD-10-CM | POA: Diagnosis not present

## 2019-08-10 DIAGNOSIS — H2513 Age-related nuclear cataract, bilateral: Secondary | ICD-10-CM | POA: Diagnosis not present

## 2019-08-29 ENCOUNTER — Ambulatory Visit (HOSPITAL_COMMUNITY)
Admission: RE | Admit: 2019-08-29 | Payer: Medicare HMO | Source: Ambulatory Visit | Attending: Cardiovascular Disease | Admitting: Cardiovascular Disease

## 2019-09-12 ENCOUNTER — Ambulatory Visit (INDEPENDENT_AMBULATORY_CARE_PROVIDER_SITE_OTHER): Payer: Medicare HMO | Admitting: Student

## 2019-09-12 ENCOUNTER — Other Ambulatory Visit: Payer: Self-pay

## 2019-09-12 ENCOUNTER — Encounter: Payer: Self-pay | Admitting: Student

## 2019-09-12 VITALS — BP 96/56 | HR 64 | Temp 98.1°F | Ht 71.0 in | Wt 151.3 lb

## 2019-09-12 DIAGNOSIS — E876 Hypokalemia: Secondary | ICD-10-CM

## 2019-09-12 DIAGNOSIS — M7989 Other specified soft tissue disorders: Secondary | ICD-10-CM

## 2019-09-12 DIAGNOSIS — N401 Enlarged prostate with lower urinary tract symptoms: Secondary | ICD-10-CM | POA: Diagnosis not present

## 2019-09-12 DIAGNOSIS — R2242 Localized swelling, mass and lump, left lower limb: Secondary | ICD-10-CM | POA: Diagnosis not present

## 2019-09-12 DIAGNOSIS — D696 Thrombocytopenia, unspecified: Secondary | ICD-10-CM

## 2019-09-12 DIAGNOSIS — Z0000 Encounter for general adult medical examination without abnormal findings: Secondary | ICD-10-CM | POA: Diagnosis not present

## 2019-09-12 DIAGNOSIS — D7589 Other specified diseases of blood and blood-forming organs: Secondary | ICD-10-CM | POA: Diagnosis not present

## 2019-09-12 DIAGNOSIS — Z0001 Encounter for general adult medical examination with abnormal findings: Secondary | ICD-10-CM

## 2019-09-12 DIAGNOSIS — R351 Nocturia: Secondary | ICD-10-CM

## 2019-09-12 DIAGNOSIS — I10 Essential (primary) hypertension: Secondary | ICD-10-CM

## 2019-09-12 DIAGNOSIS — I739 Peripheral vascular disease, unspecified: Secondary | ICD-10-CM

## 2019-09-12 DIAGNOSIS — C4922 Malignant neoplasm of connective and soft tissue of left lower limb, including hip: Secondary | ICD-10-CM | POA: Insufficient documentation

## 2019-09-12 DIAGNOSIS — F17210 Nicotine dependence, cigarettes, uncomplicated: Secondary | ICD-10-CM

## 2019-09-12 DIAGNOSIS — G609 Hereditary and idiopathic neuropathy, unspecified: Secondary | ICD-10-CM | POA: Diagnosis not present

## 2019-09-12 DIAGNOSIS — E210 Primary hyperparathyroidism: Secondary | ICD-10-CM | POA: Diagnosis not present

## 2019-09-12 DIAGNOSIS — E538 Deficiency of other specified B group vitamins: Secondary | ICD-10-CM | POA: Insufficient documentation

## 2019-09-12 DIAGNOSIS — N400 Enlarged prostate without lower urinary tract symptoms: Secondary | ICD-10-CM

## 2019-09-12 DIAGNOSIS — Z720 Tobacco use: Secondary | ICD-10-CM

## 2019-09-12 MED ORDER — VITAMIN D 50 MCG (2000 UT) PO TABS: 2000.0000 [IU] | ORAL_TABLET | Freq: Every day | ORAL | 3 refills | Status: DC

## 2019-09-12 MED ORDER — TAMSULOSIN HCL 0.4 MG PO CAPS: 0.8000 mg | ORAL_CAPSULE | Freq: Every day | ORAL | 3 refills | Status: DC

## 2019-09-12 MED ORDER — VALSARTAN 160 MG PO TABS: 160.0000 mg | ORAL_TABLET | Freq: Every day | ORAL | 3 refills | Status: DC

## 2019-09-12 MED ORDER — VITAMIN D (ERGOCALCIFEROL) 1.25 MG (50000 UNIT) PO CAPS: 50000.0000 [IU] | ORAL_CAPSULE | ORAL | 1 refills | Status: DC

## 2019-09-12 NOTE — Assessment & Plan Note (Signed)
Patient cutting back, now at 6 cigarettes per day. Offered patches or medication assistance but he declines. He states he will continue decreasing his use.

## 2019-09-12 NOTE — Patient Instructions (Addendum)
Thank you for allowing [place] to be a part of your care today, it was pleasure seeing you. We discussed your hypertension, BPH, hyperparathyroidism, hypokalemia, leg claudication, and health screenings.  I am checking these labs: BMP, CBC, folate, B12, lipid panel  I have made these changes to your medications: Decrease Valsartan to 1 tablet (160mg ) daily Increase Flomax to 2 tablets (0.8mg ) daily Refilled your vitamin D  I have also ordered the following studies: Ultrasound of your left leg nodule Ultrasound of your abdomen for abdominal aortic aneurysm screening Low dose CT of your lungs for lung cancer screening  Please also call [phone] [facility] to schedule your DEXA bone scan.  Please follow up in 6 months.  Please call if you have worsening in your dizziness, low blood pressure, or urination frequency.   Thank you, and please call the Internal Medicine Clinic at [phone] if you have any questions.  Best, Dr. Bridgett Larsson

## 2019-09-12 NOTE — Progress Notes (Signed)
   CC: follow up on chronic problems: HTN, primary hyper PTH, BPH, claudication, hypokalemia, macrocytosis, health screening  HPI:  [name] is a 74 y.o. male with history as below presenting for clinic follow up. Please refer to problem based charting for further details of assessment and plan of current problem and chronic medical conditions.   Past Medical History:  Diagnosis Date  . Arthritis   . Benign prostatic hyperplasia without lower urinary tract symptoms 06/15/2016  . BPH (benign prostatic hyperplasia)   . Cramp in limb 02/09/2018  . Edema of right lower extremity 06/15/2016  . Edema of right lower extremity 06/15/2016  . Glaucoma   . Hypertension   . Hypokalemia 11/03/2017  . Macrocytosis without anemia 01/27/2017  . Pain of right lower extremity 02/09/2018  . Primary hyperparathyroidism ([place]) 01/27/2017   Diagnosed March 2019. - PTH 108, Ca 11 (<1 over ULN), eGFR 85 (>60)  - DEXA: T: -1.1 (ostepenia not indication for surgery) - No bone pain, renal stones, constipation, or nausea - 24-hr urinary calcium not done - Supplementing Vit D   . Tachycardia 01/19/2017  . Tobacco abuse 01/26/2017  . Vision blurred 04/14/2017   Review of Systems:   Review of Systems  Constitutional: Negative for chills, diaphoresis, fever and weight loss.  HENT: Negative for congestion.   Respiratory: Negative for cough and shortness of breath.   Gastrointestinal: Negative for abdominal pain, nausea and vomiting.  Neurological: Positive for dizziness. Negative for loss of consciousness.  Psychiatric/Behavioral: Negative for depression. The patient is not nervous/anxious.      Physical Exam: Vitals:   09/12/19 1437  BP: (!) 96/56  Pulse: 64  Temp: 98.1 F (36.7 C)  TempSrc: Oral  SpO2: 98%  Weight: 151 lb 4.8 oz (68.6 kg)  Height: '5\' 11"'$  (1.803 m)   Constitutional: no acute distress Head: atraumatic ENT: external ears normal Cardiovascular: regular rate and rhythm, normal heart  sounds Pulmonary: effort normal, normal breath sounds bilaterally Abdominal: flat, nontender, no rebound tenderness, bowel sounds normal Musculoskeletal: BLE neurovascularly intact, 2.5cm nodule to the posterior L lower leg that is firm, smooth, well circumscribed, not very mobile. No overlying erythema or drainage, no tracking, nontender Skin: warm and dry, skin dry and flaking, Neurological: alert, no focal deficit Psychiatric: normal mood and affect  Assessment & Plan:   See Encounters Tab for problem based charting.  Patient seen with Dr. Daryll Drown

## 2019-09-12 NOTE — Assessment & Plan Note (Signed)
-  lipid panel -AAA screening US ordered -low dose CT lungs ordered

## 2019-09-12 NOTE — Assessment & Plan Note (Signed)
Patient reports continued leg cramping when walking 3 blocks, consistent every time he walks. Symptoms are not worsening, no nighttime pain. He saw Dr. Gwenlyn Found previously and discussed angiography, but he declines. He would prefer to have the procedure under general anesthesia. Was referred back to vascular, but did not go to appointment because he did not want the angiography done. Offered again today, and patient declines.  -continue Lipitor 20mg  -counseled on smoking cessation -consider cilostazole if worsening symptoms

## 2019-09-12 NOTE — Assessment & Plan Note (Addendum)
Noted a few weeks ago. No pain, draining, overlying erythema, fever, chills, weight loss, night sweats, hx CA. On exam, it is firm, smooth, well-circumscribed, nontender, not mobile. No tracking, overlying erythema, or drainage.  -formal ultrasound of subcutaneous nodule ordered  Addendum: Ultrasound demonstrated a complex solid mass, recommending MRI for further workup. Called patient and updated on results. MRI w/wo contrast ordered.

## 2019-09-12 NOTE — Assessment & Plan Note (Addendum)
Has had mild persistent hypokalemia as far back as 2017, last CMP on march 2021 was in normal range.   -recheck CMP today which we are also getting for Ca level  Addendum: K level normal at 3.6

## 2019-09-12 NOTE — Assessment & Plan Note (Addendum)
Denies any renal stones, bone pain, abdominal pain, or depression. Last labs in March 2021 with Ca of 10.8 and normal alb of 4.4. Taking Vitamin D supplement. DEXA last in March 2019 with T-score of -1.1 consistent with osteopenia. Repeat DEXA due in 2 years, ordered in June 2021.  -refilled vitamin D supplementation -repeat CMP today -gave patients phone number to call and schedule their previously ordered DEXA scan  Addendum: Ca of 11.1, creatinine increased to 1.55 from 1.24 previously, eGFR of 44. His creatinine level is an indication for parathyroidectomy. Discussed with patient over the phone, and he is anxious but agreeable to surgery referral. -referred to general surgery -start cinacalcet 59m BID -f/u with [place] 1 month to recheck Ca -if new DEXA scan shows osteoporosis, switch to alendronate

## 2019-09-12 NOTE — Assessment & Plan Note (Addendum)
Last CBC in March 2021 with normal hemoglobin count, MCV of 104 which has been persistently mildly elevated. Normal folate and B12 in 2018. Thrombocytopenia to 127,000 also noted.  -check folate -check B12 -check CBC  Addendum: MCV remains elevated at 102, B12 low at 205, no anemia. -start vitamin B12 1040mcg PO daily

## 2019-09-12 NOTE — Assessment & Plan Note (Signed)
Blood pressure 96/56 in office today, he reports home readings are 90s/70s for the past few weeks. Sometimes in the morning feels lightheaded upon waking, resolves with sitting back down and does not recur later in the day. No passing out.  -Decrease valsartan to 160mg  daily -continue amlodipine 10mg  daily -continue carvedilol 6.25mg  BID

## 2019-09-12 NOTE — Assessment & Plan Note (Signed)
Increased nocturia recently, gets up 5-6 times per night over the last 4 months.   -increase tamsulosin to 0.8mg  daily -continue finasteride 5mg  daily -if symtoms worsen, consider urology consult of alfuzosin      -concern for hypotension currently, so reassess this if starting on alfuzosin in the future

## 2019-09-13 ENCOUNTER — Telehealth: Payer: Self-pay | Admitting: Student

## 2019-09-13 ENCOUNTER — Other Ambulatory Visit: Payer: Self-pay | Admitting: Student

## 2019-09-13 DIAGNOSIS — D696 Thrombocytopenia, unspecified: Secondary | ICD-10-CM | POA: Insufficient documentation

## 2019-09-13 DIAGNOSIS — D7589 Other specified diseases of blood and blood-forming organs: Secondary | ICD-10-CM

## 2019-09-13 DIAGNOSIS — E210 Primary hyperparathyroidism: Secondary | ICD-10-CM

## 2019-09-13 LAB — LIPID PANEL
Chol/HDL Ratio: 1.6 ratio (ref 0.0–5.0)
Cholesterol, Total: 97 mg/dL — ABNORMAL LOW (ref 100–199)
HDL: 62 mg/dL (ref >39)
LDL Chol Calc (NIH): 21 mg/dL (ref 0–99)
Triglycerides: 61 mg/dL (ref 0–149)
VLDL Cholesterol Cal: 14 mg/dL (ref 5–40)

## 2019-09-13 LAB — VITAMIN B12: Vitamin B-12: 205 pg/mL — ABNORMAL LOW (ref 232–1245)

## 2019-09-13 LAB — CMP14 + ANION GAP
ALT: 31 IU/L (ref 0–44)
AST: 37 IU/L (ref 0–40)
Albumin/Globulin Ratio: 1.9 (ref 1.2–2.2)
Albumin: 4.5 g/dL (ref 3.7–4.7)
Alkaline Phosphatase: 88 IU/L (ref 48–121)
Anion Gap: 20 mmol/L — ABNORMAL HIGH (ref 10.0–18.0)
BUN/Creatinine Ratio: 10 (ref 10–24)
BUN: 15 mg/dL (ref 8–27)
Bilirubin Total: 1.6 mg/dL — ABNORMAL HIGH (ref 0.0–1.2)
CO2: 19 mmol/L — ABNORMAL LOW (ref 20–29)
Calcium: 11.1 mg/dL — ABNORMAL HIGH (ref 8.6–10.2)
Chloride: 104 mmol/L (ref 96–106)
Creatinine, Ser: 1.55 mg/dL — ABNORMAL HIGH (ref 0.76–1.27)
GFR calc Af Amer: 51 mL/min/{1.73_m2} — ABNORMAL LOW (ref >59)
GFR calc non Af Amer: 44 mL/min/{1.73_m2} — ABNORMAL LOW (ref >59)
Globulin, Total: 2.4 g/dL (ref 1.5–4.5)
Glucose: 79 mg/dL (ref 65–99)
Potassium: 3.6 mmol/L (ref 3.5–5.2)
Sodium: 143 mmol/L (ref 134–144)
Total Protein: 6.9 g/dL (ref 6.0–8.5)

## 2019-09-13 LAB — CBC
Hematocrit: 39 % (ref 37.5–51.0)
Hemoglobin: 13.3 g/dL (ref 13.0–17.7)
MCH: 34.8 pg — ABNORMAL HIGH (ref 26.6–33.0)
MCHC: 34.1 g/dL (ref 31.5–35.7)
MCV: 102 fL — ABNORMAL HIGH (ref 79–97)
Platelets: 129 10*3/uL — ABNORMAL LOW (ref 150–450)
RBC: 3.82 x10E6/uL — ABNORMAL LOW (ref 4.14–5.80)
RDW: 11.8 % (ref 11.6–15.4)
WBC: 5.4 10*3/uL (ref 3.4–10.8)

## 2019-09-13 LAB — FOLATE: Folate: 8.3 ng/mL (ref >3.0)

## 2019-09-13 MED ORDER — VITAMIN B-12 1000 MCG PO TABS: 1000.0000 ug | ORAL_TABLET | Freq: Every day | ORAL | 1 refills | Status: DC

## 2019-09-13 MED ORDER — CINACALCET HCL 30 MG PO TABS: 30.0000 mg | ORAL_TABLET | Freq: Two times a day (BID) | ORAL | 2 refills | Status: DC

## 2019-09-13 NOTE — Assessment & Plan Note (Addendum)
-  f/u platelet level on CBC today  Addendum: Thrombocytopenia persistent at 129,000.  -blood smear review by pathologist -follow up CBC on next visit  Addendum 2: lab was unable to conduct blood smear with the submitted sample. Collect blood smear at next visit, scheduled for 9/13

## 2019-09-13 NOTE — Progress Notes (Addendum)
Phone call made to patient.  Discussed his lab results from yesterday, significant for Calcium of 11.1 and increasing creatinine of 1.55. He agrees with referral to surgery though he is anxious. Also discussed starting cinacalcet 30mg  BID and following up with [place] in 1 month to recheck calcium. Re-emphasized importance of his ordered DEXA scan, he states he will call the provided number to schedule his scan.  Also discussed his vitamin B12 deficiency and prescription of B12 replacement.

## 2019-09-18 LAB — PATHOLOGIST SMEAR REVIEW

## 2019-09-18 NOTE — Progress Notes (Signed)
Internal Medicine Clinic Attending  I saw and evaluated the patient.  I personally confirmed the key portions of the history and exam documented by Dr. Chen and I reviewed pertinent patient test results.  The assessment, diagnosis, and plan were formulated together and I agree with the documentation in the resident's note.  

## 2019-09-27 ENCOUNTER — Other Ambulatory Visit: Payer: Self-pay

## 2019-09-27 ENCOUNTER — Ambulatory Visit (HOSPITAL_COMMUNITY)
Admission: RE | Admit: 2019-09-27 | Discharge: 2019-09-27 | Disposition: A | Discharge status: Home / Self Care | Payer: Medicare HMO | Source: Ambulatory Visit | Attending: Internal Medicine | Admitting: Internal Medicine

## 2019-09-27 DIAGNOSIS — I700 Atherosclerosis of aorta: Secondary | ICD-10-CM | POA: Insufficient documentation

## 2019-09-27 DIAGNOSIS — Z136 Encounter for screening for cardiovascular disorders: Secondary | ICD-10-CM | POA: Diagnosis not present

## 2019-09-27 DIAGNOSIS — F17200 Nicotine dependence, unspecified, uncomplicated: Secondary | ICD-10-CM | POA: Insufficient documentation

## 2019-09-27 DIAGNOSIS — Z0000 Encounter for general adult medical examination without abnormal findings: Secondary | ICD-10-CM

## 2019-09-27 DIAGNOSIS — R2242 Localized swelling, mass and lump, left lower limb: Secondary | ICD-10-CM | POA: Diagnosis not present

## 2019-09-27 DIAGNOSIS — M7989 Other specified soft tissue disorders: Secondary | ICD-10-CM | POA: Diagnosis not present

## 2019-09-27 DIAGNOSIS — Z87891 Personal history of nicotine dependence: Secondary | ICD-10-CM | POA: Diagnosis not present

## 2019-09-27 NOTE — Telephone Encounter (Signed)
A user error has taken place: encounter opened in error, closed for administrative reasons.

## 2019-09-28 ENCOUNTER — Telehealth: Payer: Self-pay | Admitting: Student

## 2019-09-28 DIAGNOSIS — R2242 Localized swelling, mass and lump, left lower limb: Secondary | ICD-10-CM

## 2019-09-28 NOTE — Telephone Encounter (Signed)
Called patient and informed him of ultrasound results which demonstrated a complex solid mass. Plan for MRI for further characterization. He is nervous but agreeable with this plan.

## 2019-10-03 ENCOUNTER — Ambulatory Visit (HOSPITAL_COMMUNITY): Payer: Medicare HMO

## 2019-10-22 ENCOUNTER — Other Ambulatory Visit: Payer: Self-pay

## 2019-10-22 ENCOUNTER — Ambulatory Visit: Payer: Medicare HMO | Admitting: Podiatry

## 2019-10-22 ENCOUNTER — Ambulatory Visit (INDEPENDENT_AMBULATORY_CARE_PROVIDER_SITE_OTHER): Payer: Medicare HMO | Admitting: Student

## 2019-10-22 ENCOUNTER — Encounter: Payer: Self-pay | Admitting: Student

## 2019-10-22 VITALS — BP 103/50 | HR 65 | Wt 149.3 lb

## 2019-10-22 DIAGNOSIS — I739 Peripheral vascular disease, unspecified: Secondary | ICD-10-CM

## 2019-10-22 DIAGNOSIS — E876 Hypokalemia: Secondary | ICD-10-CM

## 2019-10-22 DIAGNOSIS — E210 Primary hyperparathyroidism: Secondary | ICD-10-CM | POA: Diagnosis not present

## 2019-10-22 DIAGNOSIS — Z23 Encounter for immunization: Secondary | ICD-10-CM

## 2019-10-22 DIAGNOSIS — I10 Essential (primary) hypertension: Secondary | ICD-10-CM

## 2019-10-22 DIAGNOSIS — Z0001 Encounter for general adult medical examination with abnormal findings: Secondary | ICD-10-CM | POA: Diagnosis not present

## 2019-10-22 DIAGNOSIS — E538 Deficiency of other specified B group vitamins: Secondary | ICD-10-CM

## 2019-10-22 DIAGNOSIS — M25572 Pain in left ankle and joints of left foot: Secondary | ICD-10-CM

## 2019-10-22 DIAGNOSIS — Z720 Tobacco use: Secondary | ICD-10-CM

## 2019-10-22 DIAGNOSIS — R252 Cramp and spasm: Secondary | ICD-10-CM

## 2019-10-22 DIAGNOSIS — F17210 Nicotine dependence, cigarettes, uncomplicated: Secondary | ICD-10-CM

## 2019-10-22 DIAGNOSIS — D518 Other vitamin B12 deficiency anemias: Secondary | ICD-10-CM

## 2019-10-22 DIAGNOSIS — N401 Enlarged prostate with lower urinary tract symptoms: Secondary | ICD-10-CM

## 2019-10-22 DIAGNOSIS — D696 Thrombocytopenia, unspecified: Secondary | ICD-10-CM

## 2019-10-22 DIAGNOSIS — D7589 Other specified diseases of blood and blood-forming organs: Secondary | ICD-10-CM

## 2019-10-22 DIAGNOSIS — Z0000 Encounter for general adult medical examination without abnormal findings: Secondary | ICD-10-CM

## 2019-10-22 DIAGNOSIS — D649 Anemia, unspecified: Secondary | ICD-10-CM

## 2019-10-22 DIAGNOSIS — N179 Acute kidney failure, unspecified: Secondary | ICD-10-CM

## 2019-10-22 DIAGNOSIS — M25472 Effusion, left ankle: Secondary | ICD-10-CM

## 2019-10-22 DIAGNOSIS — N400 Enlarged prostate without lower urinary tract symptoms: Secondary | ICD-10-CM

## 2019-10-22 DIAGNOSIS — D519 Vitamin B12 deficiency anemia, unspecified: Secondary | ICD-10-CM

## 2019-10-22 DIAGNOSIS — R2242 Localized swelling, mass and lump, left lower limb: Secondary | ICD-10-CM

## 2019-10-22 DIAGNOSIS — M2540 Effusion, unspecified joint: Secondary | ICD-10-CM | POA: Insufficient documentation

## 2019-10-22 LAB — CBC WITH DIFFERENTIAL/PLATELET
Abs Immature Granulocytes: 0.01 10*3/uL (ref 0.00–0.07)
Basophils Absolute: 0.1 10*3/uL (ref 0.0–0.1)
Basophils Relative: 2 %
Eosinophils Absolute: 0.4 10*3/uL (ref 0.0–0.5)
Eosinophils Relative: 8 %
HCT: 37.4 % — ABNORMAL LOW (ref 39.0–52.0)
Hemoglobin: 12.2 g/dL — ABNORMAL LOW (ref 13.0–17.0)
Immature Granulocytes: 0 %
Lymphocytes Relative: 48 %
Lymphs Abs: 2.2 10*3/uL (ref 0.7–4.0)
MCH: 33.8 pg (ref 26.0–34.0)
MCHC: 32.6 g/dL (ref 30.0–36.0)
MCV: 103.6 fL — ABNORMAL HIGH (ref 80.0–100.0)
Monocytes Absolute: 0.5 10*3/uL (ref 0.1–1.0)
Monocytes Relative: 10 %
Neutro Abs: 1.5 10*3/uL — ABNORMAL LOW (ref 1.7–7.7)
Neutrophils Relative %: 32 %
Platelets: 165 10*3/uL (ref 150–400)
RBC: 3.61 MIL/uL — ABNORMAL LOW (ref 4.22–5.81)
RDW: 12.6 % (ref 11.5–15.5)
WBC: 4.7 10*3/uL (ref 4.0–10.5)
nRBC: 0 % (ref 0.0–0.2)

## 2019-10-22 LAB — COMPREHENSIVE METABOLIC PANEL
ALT: 16 U/L (ref 0–44)
AST: 25 U/L (ref 15–41)
Albumin: 3.6 g/dL (ref 3.5–5.0)
Alkaline Phosphatase: 55 U/L (ref 38–126)
Anion gap: 10 (ref 5–15)
BUN: 20 mg/dL (ref 8–23)
CO2: 27 mmol/L (ref 22–32)
Calcium: 9.1 mg/dL (ref 8.9–10.3)
Chloride: 102 mmol/L (ref 98–111)
Creatinine, Ser: 2.28 mg/dL — ABNORMAL HIGH (ref 0.61–1.24)
GFR calc Af Amer: 32 mL/min — ABNORMAL LOW (ref >60)
GFR calc non Af Amer: 27 mL/min — ABNORMAL LOW (ref >60)
Glucose, Bld: 96 mg/dL (ref 70–99)
Potassium: 4.1 mmol/L (ref 3.5–5.1)
Sodium: 139 mmol/L (ref 135–145)
Total Bilirubin: 1.6 mg/dL — ABNORMAL HIGH (ref 0.3–1.2)
Total Protein: 7 g/dL (ref 6.5–8.1)

## 2019-10-22 MED ORDER — TAMSULOSIN HCL 0.4 MG PO CAPS: 0.4000 mg | ORAL_CAPSULE | Freq: Every day | ORAL | 3 refills | Status: DC

## 2019-10-22 NOTE — Assessment & Plan Note (Signed)
Patient reports he has cut back even further, now at 4-5 cigarettes daily. Offered nicotine patch and/or medication assistance, but he declines, stating he will continue to cut back on his own.  - Revisit cessation at subsequent visits

## 2019-10-22 NOTE — Assessment & Plan Note (Signed)
Patient reports he was supposed to go to [facility] earlier in the month for left lower extremity MRI however at that time he could not place weight on his left ankle due to pain and swelling (see problem list for more history).   Exam is consistent with prior. Patient's fiance reports she thinks the nodule has grown in size.  - Counseled patient regarding the importance of getting the MRI of the nodule; Discussed with clinic nursing who informed patient someone will reach out to schedule

## 2019-10-22 NOTE — Patient Instructions (Addendum)
Mr. Bryan Vincent,   Thank you for your visit to the [facility] today. It was a pleasure seeing you. Today we discussed the following:  1) Left lower leg nodule: - We have ordered another imaging study (MRI) of your leg nodule. Someone will call you to schedule this.  2) Blood pressure: - Your blood pressures have been on the lower side for your last couple of clinic visits. - Decrease Valsartan to 1 tablet (160mg ) daily - Monitor your blood pressures once daily in the morning and bring this record with you to your next appointment.  3) Elevated calcium (hyperparathyroidism) - We have placed a referral to a general surgeon. Someone will call you to schedule an appointment. - We are also repeating blood work today to monitor your calcium level.  4) Swollen left ankle: - I'm glad your ankle is better. Today we are checking a blood level of uric acid to see if you are predisposed to gout. I will let you know if this is abnormal. - Give [place] a call if you experience this again  5) Lab tests:  - Checking your blood counts (CBC) to monitor your platelet levels - Checking your electrolytes - Checking uric acid level - Checking vitamin B12 level  6) Other imaging - Low dose CT of your lungs for lung cancer screening was ordered - Please also call [phone] [facility] to schedule your DEXA bone scan.  We would like to see you back in 1-2 months. Please bring all of your medications with you.   If you have any questions or concerns, please call our clinic at [phone] between 9am-5pm. Outside of these hours, call [phone] and ask for the internal medicine resident on call. If you feel you are having a medical emergency please call 911.

## 2019-10-22 NOTE — Assessment & Plan Note (Addendum)
Patient reports he has been taking his oral vitamin B12 supplement, 1000 mg daily, since prescribed on 09/13/19. He denies mood changes or muscle weakness. His reported bilateral hand cramping could be a manifestation of B12 deficiency. He denies lower extremity numbness or paresthesias.  Plan: - repeat CBC today --> Hemoglobin decreased to 12.2 from 13.3. MCV increased to 103.6 from 102. - vitamin B12 level repeated today - continue oral supplementation, 1000 mg daily

## 2019-10-22 NOTE — Assessment & Plan Note (Signed)
Patient reports he has been taking his oral vitamin B12 supplement, 1000 mg daily, since prescribed on 09/13/19. He denies mood changes or muscle weakness. His reported bilateral hand cramping could be a manifestation of B12 deficiency. He denies lower extremity numbness or paresthesias.  Plan: - repeat CBC today --> Hemoglobin decreased to 12.2 from 13.3. MCV increased to 103.6 from 102. - vitamin B12 level repeated today - continue oral supplementation, 1000 mg daily

## 2019-10-22 NOTE — Assessment & Plan Note (Signed)
Patient reports his leg cramping is persistent and unchanged from prior, threshold is still ~3 blocks. Has previously been seen by vascular surgery (Dr. Gwenlyn Found) however has not wanted angiography done.  - continue Lipitor 20 mg - revisit smoking cessation at every visit - consider cilostazole if worsening symptoms

## 2019-10-22 NOTE — Assessment & Plan Note (Signed)
Patient reports his nocturia is improved since last clinic visit, down to 3-4 times per night from 5-6 times per night. He reports this is even despite having not increased his tamsulosin from 0.4 mg to 0.8 mg daily.  A/P: Since nocturia has improved despite not increasing tamsulosin and in setting of the patient's softer blood pressures: - tamsulosin 0.4 mg daily

## 2019-10-22 NOTE — Assessment & Plan Note (Signed)
Patient reports about two weeks ago, around 10/10/19, he woke up to intense pain and swelling of his left ankle and anterolateral portion of his left foot. The pain was such that he could not place weight on the foot. He was able to move the foot, however it was painful.There was no preceding trauma. He had never experienced pain in the ankle or pain in a joint like that before. He did not try any OTC pain meds. Elevation of the foot and twice daily soaks in Epsom salt solution seemed to alleviate the pain and swelling, which resolved over the course of a week.  Then about one week ago, last Wednesday 10/17/19, he noticed pain and swelling of the medial aspect of his R hand. He denies R wrist pain and/or tenderness. Denies trauma to the area. He had never experienced before. Similarly, he soaked the hand in Epsom salt solution, and the pain and swelling subsided over the next 3-4 days. Today he reports mild lingering pain in the area. Sensation is intact.   On exam of the L ankle/foot, there is trace edema at the ankle. No overlying swelling, erythema, or tenderness. He states the ankle appears at its baseline. Full range of motion with active and passive motion. R hand also appears normal without swelling, erythema, or warmth. Sensation is intact.  A/P: Given acute onset of pain and swelling in/near the L ankle, gout is on the differential. Will not tap the joint as it does not appear to be inflamed currently. However, will check a uric acid level. - Uric acid level - Advised patient to call and schedule in-person appointment if his symptoms return

## 2019-10-22 NOTE — Assessment & Plan Note (Addendum)
Blood pressure 103/50 today. Denies dizziness or lightheadedness at rest, upon standing, or after standing for long periods. Reports he drinks water frequently throughout the day.  Reports he did NOT decrease his valsartan from 320 mg daily to valsartan 160 mg daily as instructed at his last visit.  A/P: Since his pressures are persistently low, advised patient to: - decrease valsartan to 160 mg daily (from 320 mg daily) - continue amolodipine 10 mg daily - continue carvedilol 6.25 mg daily  ADDENDUM: due to worsening renal function noted from today's labs, instructed patient to HOLD his valsartan.

## 2019-10-22 NOTE — Assessment & Plan Note (Addendum)
Patient reports he received a phone call for appointment with a Dr. Harlow Asa, however due to pain in his L ankle and difficulty walking (see left ankle pain problem in this note) he was unable to go. Dr. Harlow Asa appears to be a general surgeon to whom a referral was placed for consideration of parathyroidectomy. Patient reports he has been taking the cinacalcet 30 mg twice daily as prescribed last visit and continues with his vitamin D supplementation. He states he did not make a call to schedule the DEXA scan as instructed at last visit.  Plan:  - CMP today to monitor calcium level, sCr, eGFR - Notified clinic staff regarding missed general surgery referral. Someone will reach out to the patient to reschedule. - gave patient the phone number to schedule previously-ordered DEXA scan - if new DEXA scan shows osteoporosis, switch to alendronate  ADDENDUM: - Calcium decreased to 9.1 today from 11.1 one month ago - Creatinine increased to 2.28 from 1.55 one month ago - eGFR decreased to 32 from 51 one month ago  Will bring patient back for further testing tomorrow, 10/23/19, due to increase in creatinine and decrease in eGFR. Plan for the following work-up:  - Urine Na and Urine creatinine to calculate FENa to assess pre-renal vs intrinsic vs post-renal etiology - Urinalysis to evaluate for presence of blood, casts, and/or protein - Urine protein and urine creatinine to estimate protein excretion by calculating urine protein to creatinine ratio - Will measure a post-void residual to evaluate for possible obstruction. May scan ureters to evaluate for hydronephrosis. Patient may require renal ultrasound. - Given the patient's hypercalcemia, renal dysfunction, anemia, presentation concerning for multiple myeloma, so will screen with SPEP, serum FLC, and serum immunofixation.

## 2019-10-22 NOTE — Assessment & Plan Note (Signed)
CBC today significant for anemia with hemoglobin of 12.2, MCV 103.6.  - repeat B12 level pending - continue vitamin B12 1000 mcg PO daily

## 2019-10-22 NOTE — Assessment & Plan Note (Signed)
-   CBC repeated today --> platelets increased to 165 from 129 one month ago - blood smear review by pathologist today

## 2019-10-22 NOTE — Progress Notes (Signed)
   CC: left ankle and right hand swelling  HPI:  [name] is a 74 y.o. gentleman with history of HTN, primary hyper PTH, BPH, claudication, hypokalemia, vitamin B12 deficiency who presents to clinic for follow-up on above as well as recent painful left ankle swelling. His last clinic visit was on 09/12/19 with his PCP, Dr. Bridgett Larsson.   To see the details of this patient's management of their acute and chronic problems, please refer to the Assessment & Plan under the Encounters tab.   Note: Today's resulted labs significant for:  - Creatinine increased to 2.28 from 1.55 one month ago - eGFR decreased to 32 from 51 one month ago - Calcium decreased to 9.1 today from 11.1 one month ago  Will bring patient back for further testing tomorrow, 10/23/19, due to increase in creatinine and decrease in eGFR. Plan for the following work-up:  - Urine Na and Urine creatinine to calculate FENa to assess pre-renal vs intrinsic vs post-renal etiology - Urinalysis to evaluate for presence of blood, casts, and/or protein - Urine protein and urine creatinine to estimate protein excretion by calculating urine protein to creatinine ratio - Will measure a post-void residual to evaluate for possible obstruction. May scan ureters to evaluate for hydronephrosis. Patient may require renal ultrasound. - Given the patient's hypercalcemia, renal dysfunction, anemia, presentation concerning for multiple myeloma, so will screen with SPEP, serum FLC, and serum immunofixation.  Past Medical History:  Diagnosis Date  . Arthritis   . Benign prostatic hyperplasia without lower urinary tract symptoms 06/15/2016  . BPH (benign prostatic hyperplasia)   . Cramp in limb 02/09/2018  . Edema of right lower extremity 06/15/2016  . Edema of right lower extremity 06/15/2016  . Glaucoma   . Hypertension   . Hypokalemia 11/03/2017  . Macrocytosis without anemia 01/27/2017  . Pain of right lower extremity 02/09/2018  . Primary  hyperparathyroidism ([place]) 01/27/2017   Diagnosed March 2019. - PTH 108, Ca 11 (<1 over ULN), eGFR 85 (>60)  - DEXA: T: -1.1 (ostepenia not indication for surgery) - No bone pain, renal stones, constipation, or nausea - 24-hr urinary calcium not done - Supplementing Vit D   . Tachycardia 01/19/2017  . Tobacco abuse 01/26/2017  . Vision blurred 04/14/2017   Review of Systems:    Review of Systems  Constitutional: Negative for fever.  Cardiovascular: Positive for claudication and leg swelling.  Genitourinary: Negative for dysuria.  Musculoskeletal: Positive for joint pain.  Skin: Positive for itching.  Neurological: Negative for dizziness and weakness.  Endo/Heme/Allergies: Does not bruise/bleed easily.    Physical Exam:  Vitals:   10/22/19 1326  BP: (!) 103/50  Pulse: 65  SpO2: 98%  Weight: 149 lb 4.8 oz (67.7 kg)   Constitutional: well-appearing gentleman sitting in chair, in no acute distress HENT: normocephalic atraumatic Neck: supple Pulmonary/Chest: effort normal on room air MSK: 3.5 cm nodule on the posterolateral left lower leg that is firm, smooth, slightly warm to touch, not very mobile, without overlying erythema or swelling, non-tender; trace edema of bilateral ankles; left ankle with full range of motion, non-tender, non-erythematous. Bilateral grip strength 5/5, no hand joint tenderness or swelling. Skin: warm and dry, also as above   Assessment & Plan:   See Encounters Tab for problem based charting.  Patient seen with Dr. Rebeca Alert

## 2019-10-22 NOTE — Assessment & Plan Note (Signed)
-   Last potassium level from 09/13/19 normal - Recheck CMP today, also getting for calcium level

## 2019-10-22 NOTE — Assessment & Plan Note (Signed)
Patient describes intermittent cramping of his bilateral hands without inciting event. Reports it has been happening more over the last week. Notices it at rest. Denies current cramping. Reports he is able to carry out his daily activities. Has been eating more bananas for the potassium. Endorses concomitant tingling during these episodes.  On exam, his grip strength is 5/5 bilaterally. No apparent joint swelling or tenderness to palpation or with gripping/movement. Sensation intact.   A/P: Patient has a history of hypokalemia, however potassium levels have been WNL recently. Low vitamin B12 could be contributing. - CMP today for K (also for calcium) - vitamin B12 level today

## 2019-10-22 NOTE — Assessment & Plan Note (Signed)
-   09/27/19 AAA screening negative - Lipid panel unremarkable - Low dose CT lungs ordered - Flu vaccine today (10/22/19)

## 2019-10-23 ENCOUNTER — Encounter: Payer: Self-pay | Admitting: Student

## 2019-10-23 ENCOUNTER — Ambulatory Visit (INDEPENDENT_AMBULATORY_CARE_PROVIDER_SITE_OTHER): Payer: Medicare HMO | Admitting: Student

## 2019-10-23 VITALS — BP 99/59 | HR 79 | Wt 149.8 lb

## 2019-10-23 DIAGNOSIS — N179 Acute kidney failure, unspecified: Secondary | ICD-10-CM | POA: Diagnosis not present

## 2019-10-23 DIAGNOSIS — I10 Essential (primary) hypertension: Secondary | ICD-10-CM | POA: Diagnosis not present

## 2019-10-23 DIAGNOSIS — D649 Anemia, unspecified: Secondary | ICD-10-CM | POA: Diagnosis not present

## 2019-10-23 LAB — VITAMIN B12: Vitamin B-12: 884 pg/mL (ref 232–1245)

## 2019-10-23 LAB — PATHOLOGIST SMEAR REVIEW

## 2019-10-23 LAB — URIC ACID: Uric Acid: 8.3 mg/dL (ref 3.8–8.4)

## 2019-10-23 NOTE — Assessment & Plan Note (Signed)
BP 103/50. Patient continues to be asymptomatic.  Plan: - Patient instructed to continue to HOLD his valsartan in light of softer pressures and AKI

## 2019-10-23 NOTE — Assessment & Plan Note (Addendum)
Yesterday's resulted labs significant for:  - Creatinine increased to 2.28 from 1.55 one month ago - eGFR decreased to 32 from 51 one month ago - Calcium decreased to 9.1 today from 11.1 one month ago  Patient presents today for further work-up and evaluation of his acute vs subacute kidney injury.He reports he is overall feeling well. He reports he has been urinating at his baseline frequency, which is several times per day. He denies dysuria, dribbling, sensation of incomplete voiding. He denies abdominal pain, flank pain, shortness of breath. Has not noticed any leg or abdominal swelling. He reports he drinks about 4 12-oz cans of beer and 4 12-oz bottles of water daily. Denies night sweats or bone pain. Has noticed a decrease in his appetite over the last year with corresponding unintentional weight loss of ~10 pounds over the last 6 months.  On exam, the patient is well-appearing. There is trace bilateral ankle swelling. No abdominal pain or CVA tenderness.   Assessment/Plan:   We are considering pre-renal, intrarenal, and post-renal etiologies of acute or subacute kidney injury. Reassured that patient is urinating without issue, is euvolemic on exam, and had no electrolyte derangements on labs yesterday.  - Will repeat BMP. If creatinine is worse, may need to admit the patient for expedited work-up and possible nephrology referral. - Post-void residual measured with bladder scan today only 32 cc. This lowers suspicion for a bladder- or urethral-level obstruction. Renal ultrasound ordered. - Patient's blood pressure has been on the lower side of normal over the last month. Unsure what to make of that. Does raise some concern for poor perfusion. Holding his valsartan 160 mg, continuing his amlodipine 10 mg daily. - Patient reports adequate fluid intake and urine output, mucous membranes are moist, making dehydration a less likely etiology. - As for intrarenal etiologies, we are holding  nephrotoxic medications, including NSAIDs and valsartan. - Given the patient's recent unintentional weight loss in the setting of hypercalcemia and anemia, concerned for possible multiple myeloma, so obtaining SPEP, serum FLC, and serum immunofixation - Urine Na and Urine creatinine collected to calculate FENa to assess pre-renal vs intrinsic vs post-renal etiology - Urinalysis to evaluate for presence of blood, casts, and/or protein - Urine protein / creatinine ratio to estimate protein excretion   ADDENDUM: - serum creatinine improved to 1.77 from 2.28 the day prior - urinalysis unremarkable except for 6-10 WBC. No blood or casts. - urine protein/creatinine ratio 47 - FENa calculated to be 0.2%, which is supportive of pre-renal etiology (serum Na 140, serum Cr 1.77, urine Na 39, urine creatinine 243.7) - full MM work-up still pending but results so far consistent more with a generalized inflammation picture - Will call patient to advise continued hydration, decrease beer intake, continue off of valsartan. Will ask patient to come back next week for repeat BMP and blood pressure check.

## 2019-10-23 NOTE — Progress Notes (Signed)
   CC: follow-up for kidney function  HPI:  [name] is a 74 y.o. person with history of HTN, primary hyper PTH, BPH, claudication, hypokalemia, vitamin B12 deficiency  who presents to clinic for follow-up after labs obtained at his clinic visit yesterday, 10/22/19, demonstrated worsening of his kidney function.    To see the details of this patient's management of their acute and chronic problems, please refer to the Assessment & Plan under the Encounters tab.    Past Medical History:  Diagnosis Date  . Arthritis   . Benign prostatic hyperplasia without lower urinary tract symptoms 06/15/2016  . BPH (benign prostatic hyperplasia)   . Cramp in limb 02/09/2018  . Edema of right lower extremity 06/15/2016  . Edema of right lower extremity 06/15/2016  . Glaucoma   . Hypertension   . Hypokalemia 11/03/2017  . Macrocytosis without anemia 01/27/2017  . Pain of right lower extremity 02/09/2018  . Primary hyperparathyroidism ([place]) 01/27/2017   Diagnosed March 2019. - PTH 108, Ca 11 (<1 over ULN), eGFR 85 (>60)  - DEXA: T: -1.1 (ostepenia not indication for surgery) - No bone pain, renal stones, constipation, or nausea - 24-hr urinary calcium not done - Supplementing Vit D   . Tachycardia 01/19/2017  . Tobacco abuse 01/26/2017  . Vision blurred 04/14/2017   Review of Systems:    Review of Systems  Constitutional: Positive for weight loss. Negative for chills and fever.  Respiratory: Negative for shortness of breath.   Cardiovascular: Negative for chest pain and leg swelling.  Gastrointestinal: Negative for abdominal pain and constipation.  Genitourinary: Negative for dysuria, flank pain, frequency, hematuria and urgency.  Neurological: Negative for dizziness, weakness and headaches.  Endo/Heme/Allergies: Negative for polydipsia.   Physical Exam:  Vitals:   10/23/19 0954  BP: (!) 99/59  Pulse: 79  SpO2: 97%  Weight: 149 lb 12.8 oz (67.9 kg)   Vitals with BMI 10/23/2019 10/22/2019  09/12/2019  Height - - $R'5\' 11"'MZ$   Weight 149 lbs 13 oz 149 lbs 5 oz 151 lbs 5 oz  BMI - - 85.92  Systolic 99 763 96  Diastolic 59 50 56  Pulse 79 65 64   Constitutional: well-appearing gentleman sitting in chair, in no acute distress HENT: normocephalic atraumatic Neck: supple Pulmonary/Chest: effort normal on room air Cardiovascular: No lower extremity edema Abdominal: flat, non-distended, no tenderness, no CVA tenderness MSK: 3.5 cm nodule on the posterolateral left lower leg same as clinic visit yesterday (see yesterday's note for exam details) Skin: warm and dry, also as above   Assessment & Plan:   See Encounters Tab for problem based charting.  Patient seen with Dr. Rebeca Alert

## 2019-10-23 NOTE — Addendum Note (Signed)
Addended by: Alexandria Lodge on: 10/23/2019 11:20 AM   Modules accepted: Orders

## 2019-10-23 NOTE — Progress Notes (Signed)
Internal Medicine Clinic Attending  I saw and evaluated the patient.  I personally confirmed the key portions of the history and exam documented by Dr. Shon Baton and I reviewed pertinent patient test results.  The assessment, diagnosis, and plan were formulated together and I agree with the documentation in the resident's note.  As noted, significant rise in creatinine consistent with acute or subacute AKI. Will need to be evaluated for obstruction and intrarenal causes. With hypercalcemia and anemia in his age group, need to consider multiple myeloma. He is able to return for additional labs, urinalysis, and PVR tomorrow.  Lenice Pressman, M.D., Ph.D.

## 2019-10-23 NOTE — Patient Instructions (Addendum)
Bryan Vincent,   Thank you for coming back to the [facility] today. It was a pleasure seeing you again. Today we discussed the following:  1) Kidney function: We wanted to see you back today to run some additional tests because your labs yesterday showed worsening kidney function. We are repeating some of the labs from yesterday as well as getting others that will help [place] understand the possible cause of this dysfunction. - BMP will show [place] your creatinine level. If this level is higher, we will likely need to bring you back to the hospital to be admitted for further work-up. If it is the same or lower, we have time to wait and see the results of the other labs. - We are obtaining labs to evaluate for Multiple Myeloma. - Someone will call you to schedule an ultrasound of your kidneys - Please STOP taking your valsartan altogether. Also do not take any ibuprofen or other NSAIDs. - Continue to stay hydrated.  2) I will give you a call for an update regarding the other labs you had yesterday when these come back.  Follow-up will depend on the results of your labs today.   If you have any questions or concerns, please call our clinic at [phone] between 9am-5pm. Outside of these hours, call [phone] and ask for the internal medicine resident on call. If you feel you are having a medical emergency please call 911.

## 2019-10-24 LAB — URINALYSIS, COMPLETE
Bilirubin, UA: NEGATIVE
Glucose, UA: NEGATIVE
Ketones, UA: NEGATIVE
Nitrite, UA: NEGATIVE
Protein,UA: NEGATIVE
RBC, UA: NEGATIVE
Specific Gravity, UA: 1.018 (ref 1.005–1.030)
Urobilinogen, Ur: 1 mg/dL (ref 0.2–1.0)
pH, UA: 6 (ref 5.0–7.5)

## 2019-10-24 LAB — MICROSCOPIC EXAMINATION
Bacteria, UA: NONE SEEN
Casts: NONE SEEN /lpf
Epithelial Cells (non renal): NONE SEEN /hpf (ref 0–10)
RBC, Urine: NONE SEEN /hpf (ref 0–2)

## 2019-10-24 LAB — PROTEIN / CREATININE RATIO, URINE
Creatinine, Urine: 243.7 mg/dL
Protein, Ur: 11.4 mg/dL
Protein/Creat Ratio: 47 mg/g creat (ref 0–200)

## 2019-10-24 LAB — SODIUM, URINE, RANDOM: Sodium, Ur: 39 mmol/L

## 2019-10-24 NOTE — Progress Notes (Signed)
Internal Medicine Clinic Attending  I saw and evaluated the patient.  I personally confirmed the key portions of the history and exam documented by Dr. Shon Baton and I reviewed pertinent patient test results.  The assessment, diagnosis, and plan were formulated together and I agree with the documentation in the resident's note.  Awaiting results of tests to help determine etiology of acute to subacute AKI. Does not appear hypovolemic on exam, but BP remains on the low side, will continue to hold valsartan and NSAIDs and follow up test results. Reassuring that PVR is 32 cc, unlikely obstructive cause.  Lenice Pressman, M.D., Ph.D.

## 2019-10-25 ENCOUNTER — Other Ambulatory Visit (HOSPITAL_COMMUNITY): Payer: Medicare HMO

## 2019-10-25 LAB — IMMUNOFIXATION, SERUM
IgA/Immunoglobulin A, Serum: 477 mg/dL — ABNORMAL HIGH (ref 61–437)
IgG (Immunoglobin G), Serum: 1029 mg/dL (ref 603–1613)
IgM (Immunoglobulin M), Srm: 114 mg/dL (ref 15–143)

## 2019-10-25 LAB — BMP8+ANION GAP
Anion Gap: 16 mmol/L (ref 10.0–18.0)
BUN/Creatinine Ratio: 10 (ref 10–24)
BUN: 17 mg/dL (ref 8–27)
CO2: 22 mmol/L (ref 20–29)
Calcium: 9 mg/dL (ref 8.6–10.2)
Chloride: 102 mmol/L (ref 96–106)
Creatinine, Ser: 1.77 mg/dL — ABNORMAL HIGH (ref 0.76–1.27)
GFR calc Af Amer: 43 mL/min/{1.73_m2} — ABNORMAL LOW (ref >59)
GFR calc non Af Amer: 37 mL/min/{1.73_m2} — ABNORMAL LOW (ref >59)
Glucose: 99 mg/dL (ref 65–99)
Potassium: 4 mmol/L (ref 3.5–5.2)
Sodium: 140 mmol/L (ref 134–144)

## 2019-10-25 LAB — PE AND FLC, SERUM
A/G Ratio: 1.1 (ref 0.7–1.7)
Albumin ELP: 3.4 g/dL (ref 2.9–4.4)
Alpha 1: 0.3 g/dL (ref 0.0–0.4)
Alpha 2: 0.7 g/dL (ref 0.4–1.0)
Beta: 1 g/dL (ref 0.7–1.3)
Gamma Globulin: 1.2 g/dL (ref 0.4–1.8)
Globulin, Total: 3.2 g/dL (ref 2.2–3.9)
Ig Kappa Free Light Chain: 67.4 mg/L — ABNORMAL HIGH (ref 3.3–19.4)
Ig Lambda Free Light Chain: 35.4 mg/L — ABNORMAL HIGH (ref 5.7–26.3)
KAPPA/LAMBDA RATIO: 1.9 — ABNORMAL HIGH (ref 0.26–1.65)
Total Protein: 6.6 g/dL (ref 6.0–8.5)

## 2019-10-25 LAB — PHOSPHORUS: Phosphorus: 3.8 mg/dL (ref 2.8–4.1)

## 2019-10-30 ENCOUNTER — Ambulatory Visit: Payer: Medicare HMO | Admitting: Podiatry

## 2019-10-30 ENCOUNTER — Encounter: Payer: Self-pay | Admitting: Podiatry

## 2019-10-30 ENCOUNTER — Other Ambulatory Visit: Payer: Self-pay

## 2019-10-30 DIAGNOSIS — M79676 Pain in unspecified toe(s): Secondary | ICD-10-CM | POA: Diagnosis not present

## 2019-10-30 DIAGNOSIS — B351 Tinea unguium: Secondary | ICD-10-CM

## 2019-10-30 NOTE — Progress Notes (Signed)
Subjective: Bryan Vincent is a 74 y.o. male patient seen today painful mycotic nails b/l that are difficult to trim. Pain interferes with ambulation. Aggravating factors include wearing enclosed shoe gear. Pain is relieved with periodic professional debridement.   He states he had an episode of gout in left foot which has since resolved.  Patient Active Problem List   Diagnosis Date Noted  . AKI (acute kidney injury) ([place]) 10/23/2019  . Pain and swelling of left ankle 10/22/2019  . Macrocytic anemia with vitamin B12 deficiency 10/22/2019  . Thrombocytopenia ([place]) 09/13/2019  . Subcutaneous nodule of left lower extremity 09/12/2019  . Vitamin B12 deficiency 09/12/2019  . Claudication in peripheral vascular disease ([place]) 02/09/2018  . Cramp in limb 02/09/2018  . Preventative health care 02/09/2018  . Hypokalemia 11/03/2017  . Primary hyperparathyroidism ([place]) 01/27/2017  . Macrocytosis without anemia 01/27/2017  . Tobacco abuse 01/26/2017  . Essential hypertension 06/15/2016  . Benign prostatic hyperplasia without lower urinary tract symptoms 06/15/2016    Current Outpatient Medications on File Prior to Visit  Medication Sig Dispense Refill  . amLODipine (NORVASC) 10 MG tablet Take 1 tablet (10 mg total) by mouth daily. 90 tablet 2  . atorvastatin (LIPITOR) 20 MG tablet Take 1 tablet (20 mg total) by mouth daily. 90 tablet 1  . carvedilol (COREG) 6.25 MG tablet Take 1 tablet (6.25 mg total) by mouth 2 (two) times daily with a meal. 180 tablet 1  . Cholecalciferol (VITAMIN D) 50 MCG (2000 UT) tablet Take 1 tablet (2,000 Units total) by mouth daily. 90 tablet 3  . cinacalcet (SENSIPAR) 30 MG tablet Take 1 tablet (30 mg total) by mouth 2 (two) times daily with a meal. 60 tablet 2  . COMBIGAN 0.2-0.5 % ophthalmic solution     . finasteride (PROSCAR) 5 MG tablet Take 1 tablet (5 mg total) by mouth daily. 90 tablet 3  . LUMIGAN 0.01 % SOLN     . tamsulosin (FLOMAX) 0.4 MG CAPS capsule  Take 1 capsule (0.4 mg total) by mouth daily. 180 capsule 3  . vitamin B-12 (CYANOCOBALAMIN) 1000 MCG tablet Take 1 tablet (1,000 mcg total) by mouth daily. 90 tablet 1  . Vitamin D, Ergocalciferol, (DRISDOL) 1.25 MG (50000 UNIT) CAPS capsule Take 1 capsule (50,000 Units total) by mouth every 7 (seven) days. 12 capsule 1   No current facility-administered medications on file prior to visit.    No Known Allergies  Objective: Physical Exam  General: Bryan Vincent is a pleasant 74 y.o. African American male, in NAD. AAO x 3.   Vascular:  Neurovascular status unchanged b/l lower extremities. Capillary fill time to digits <3 seconds b/l lower extremities. Palpable DP pulses b/l. Palpable PT pulses b/l. Skin temperature gradient within normal limits b/l. No edema noted b/l.  Dermatological:  Pedal skin with normal turgor, texture and tone bilaterally. No open wounds bilaterally. No interdigital macerations bilaterally. Toenails 1-5 b/l elongated, discolored, dystrophic, thickened, crumbly with subungual debris and tenderness to dorsal palpation. No hyperkeratotic nor porokeratotic lesions present on today's visit.  Musculoskeletal:  Normal muscle strength 5/5 to all lower extremity muscle groups bilaterally. No pain crepitus or joint limitation noted with ROM b/l. No gross bony deformities bilaterally. Hallux valgus with bunion deformity noted b/l lower extremities. Hammertoes noted to the 2-5 bilaterally.  Neurological:  Protective sensation diminished with 10g monofilament b/l.  Assessment and Plan:  1. Pain due to onychomycosis of toenail    -Examined patient. -No new findings. No  new orders. -Toenails 1-5 b/l were debrided in length and girth with sterile nail nippers and dremel without iatrogenic bleeding.  -Patient to report any pedal injuries to medical professional immediately. -Patient to continue soft, supportive shoe gear daily. -Patient/POA to call should there be  question/concern in the interim.  Return in about 3 months (around 01/29/2020) for painful mycotic toenails.  Marzetta Board, DPM

## 2019-11-01 ENCOUNTER — Ambulatory Visit (HOSPITAL_COMMUNITY): Payer: Medicare HMO

## 2019-11-01 ENCOUNTER — Telehealth: Payer: Self-pay

## 2019-11-01 ENCOUNTER — Other Ambulatory Visit: Payer: Medicare HMO

## 2019-11-01 NOTE — Telephone Encounter (Signed)
Please arrange for telehealth appointment tomorrow.

## 2019-11-01 NOTE — Telephone Encounter (Signed)
Hi there - are you wanting me to call him back? If so, do we know what he's wanting to discuss?

## 2019-11-01 NOTE — Telephone Encounter (Signed)
Requesting to speak with Dr. Shon Baton. Please call pt back.

## 2019-11-01 NOTE — Telephone Encounter (Signed)
TC to patient, he states he did not have his renal u/s that Dr. Shon Baton ordered because he was not feeling well.  C/O 9/10 pain to left leg states it is swollen and is unable to walk or move his leg.  Denies SOB, chest pain, or abdominal swelling.  Pt declines appt in [facility].  Pt states he does not want to go to ER, he wants to discuss this with Dr. Shon Baton.  Will forward to Dr. Shon Baton to advise if she would like to call pt back today or have him added as telehealth appt tomorrow (no openings today). SChaplin, RN,BSN

## 2019-11-02 ENCOUNTER — Ambulatory Visit (INDEPENDENT_AMBULATORY_CARE_PROVIDER_SITE_OTHER): Payer: Medicare HMO | Admitting: Student

## 2019-11-02 ENCOUNTER — Other Ambulatory Visit: Payer: Self-pay

## 2019-11-02 DIAGNOSIS — M25572 Pain in left ankle and joints of left foot: Secondary | ICD-10-CM | POA: Diagnosis not present

## 2019-11-02 DIAGNOSIS — M25472 Effusion, left ankle: Secondary | ICD-10-CM | POA: Diagnosis not present

## 2019-11-02 DIAGNOSIS — I10 Essential (primary) hypertension: Secondary | ICD-10-CM

## 2019-11-02 NOTE — Telephone Encounter (Signed)
Added to Dr. Philip Aspen schedule on 11/02/19 @ 2:45- Telehealth, pt notified. SChaplin, RN,BSN

## 2019-11-02 NOTE — Progress Notes (Signed)
  [facility] Internal Medicine Residency Telephone Encounter Continuity Care Appointment  HPI:   This telephone encounter was created for Mr. Bryan Vincent on 11/02/2019 for the following purpose/cc: L leg pain.  Patient called the clinic on 11/01/19 and spoke with triage nursing. Reported swelling and 9/10 pain in his left leg such that he is unable to put weight on it. At that time, he denied SOB, chest pain, or abdominal swelling. He declined in-person appointment in [facility] and stated he did not want to go to ED. Agreeable to next day telehealth appointment.  During our conversation today, the patient reiterates the above. States he can't walk on his left leg because he gets a "sharp feeling from his foot to his knee cap." Notes that the leg is swollen. When asked if he thinks his leg mass is contributing, he says no. He notes that soaking in Epsom salt helps somewhat.   He denies chest pain, shortness of breath, fever/chills, cough, congestion, abdominal pain, numbness, tingling, N/V.   Past Medical History:  Past Medical History:  Diagnosis Date  . Arthritis   . Benign prostatic hyperplasia without lower urinary tract symptoms 06/15/2016  . BPH (benign prostatic hyperplasia)   . Cramp in limb 02/09/2018  . Edema of right lower extremity 06/15/2016  . Edema of right lower extremity 06/15/2016  . Glaucoma   . Hypertension   . Hypokalemia 11/03/2017  . Macrocytosis without anemia 01/27/2017  . Pain of right lower extremity 02/09/2018  . Primary hyperparathyroidism ([place]) 01/27/2017   Diagnosed March 2019. - PTH 108, Ca 11 (<1 over ULN), eGFR 85 (>60)  - DEXA: T: -1.1 (ostepenia not indication for surgery) - No bone pain, renal stones, constipation, or nausea - 24-hr urinary calcium not done - Supplementing Vit D   . Tachycardia 01/19/2017  . Tobacco abuse 01/26/2017  . Vision blurred 04/14/2017      ROS:  Review of Systems  Constitutional: Negative for chills and fever.  HENT: Negative for  congestion and sore throat.   Respiratory: Negative for cough and shortness of breath.   Cardiovascular: Positive for leg swelling. Negative for chest pain and palpitations.  Gastrointestinal: Negative for abdominal pain.  Genitourinary: Negative for dysuria.  Musculoskeletal: Negative for myalgias.  Neurological: Negative for dizziness, focal weakness, weakness and headaches.     Assessment / Plan / Recommendations:   Please see A&P under problem oriented charting for assessment of the patient's acute and chronic medical conditions.   As always, pt is advised that if symptoms worsen or new symptoms arise, they should go to an urgent care facility or to to ER for further evaluation.   Consent and Medical Decision Making:   Patient seen with Dr. Dareen Piano  This is a telephone encounter between Bryan Vincent and [place] on 11/02/2019 for discussion regarding left leg pain and swelling since patient refused in-person clinic visit and did not want to be seen in the ED. The visit was conducted with the patient located at home and [place] at [facility]. The patient's identity was confirmed using their DOB and current address. The patient has consented to being evaluated through a telephone encounter and understands the associated risks (an examination cannot be done and the patient may need to come in for an appointment) / benefits (allows the patient to remain at home, decreasing exposure to coronavirus). I personally spent 15 minutes on medical discussion.

## 2019-11-04 ENCOUNTER — Encounter: Payer: Self-pay | Admitting: Student

## 2019-11-04 MED ORDER — CARVEDILOL 6.25 MG PO TABS: 6.2500 mg | ORAL_TABLET | Freq: Two times a day (BID) | ORAL | 1 refills | Status: DC

## 2019-11-04 NOTE — Assessment & Plan Note (Addendum)
Patient called the clinic on 11/01/19 and spoke with triage nursing. Reported swelling and 9/10 pain in his left leg such that he is unable to put weight on it. At that time, he denied SOB, chest pain, or abdominal swelling. He declined in-person appointment in [facility] and stated he did not want to go to ED. Agreeable to next day telehealth appointment.  During our conversation today, the patient reiterates the above. States he can't walk on his left leg because he gets a "sharp feeling from his foot to his knee cap." Notes that the leg is swollen. When asked if he thinks his leg mass is contributing, he says no. He notes that soaking in Epsom salt helps somewhat.   He denies chest pain, shortness of breath, fever/chills, cough, congestion, abdominal pain, numbness, tingling, N/V.  A/P: Counseled patient that without an in-person evaluation, I am unable to tell the difference between pain and swelling of the lower leg secondary to skin infection vs DVT vs fracture. Uric acid level drawn during last visit 8.3. This does not sound like a gout flare. Given limitation of telehealth visit, advised patient to present to the ED for evaluation. He stated he would try. - Advised patient present to the ED for evaluation since cannot rule out dvt vs cellulitis vs fracture in a phone visit - Scheduled patient to be seen in-person next week

## 2019-11-05 ENCOUNTER — Encounter (HOSPITAL_COMMUNITY): Payer: Self-pay | Admitting: *Deleted

## 2019-11-05 ENCOUNTER — Other Ambulatory Visit: Payer: Self-pay

## 2019-11-05 ENCOUNTER — Emergency Department (HOSPITAL_COMMUNITY)
Admission: EM | Admit: 2019-11-05 | Discharge: 2019-11-06 | Disposition: A | Discharge status: Home / Self Care | Payer: Medicare HMO | Attending: Emergency Medicine | Admitting: Emergency Medicine

## 2019-11-05 ENCOUNTER — Telehealth: Payer: Self-pay | Admitting: *Deleted

## 2019-11-05 DIAGNOSIS — Z79899 Other long term (current) drug therapy: Secondary | ICD-10-CM | POA: Insufficient documentation

## 2019-11-05 DIAGNOSIS — I959 Hypotension, unspecified: Secondary | ICD-10-CM | POA: Diagnosis not present

## 2019-11-05 DIAGNOSIS — F17200 Nicotine dependence, unspecified, uncomplicated: Secondary | ICD-10-CM | POA: Diagnosis not present

## 2019-11-05 DIAGNOSIS — M25571 Pain in right ankle and joints of right foot: Secondary | ICD-10-CM | POA: Insufficient documentation

## 2019-11-05 DIAGNOSIS — I10 Essential (primary) hypertension: Secondary | ICD-10-CM | POA: Diagnosis not present

## 2019-11-05 DIAGNOSIS — M25572 Pain in left ankle and joints of left foot: Secondary | ICD-10-CM | POA: Insufficient documentation

## 2019-11-05 DIAGNOSIS — R531 Weakness: Secondary | ICD-10-CM | POA: Diagnosis not present

## 2019-11-05 DIAGNOSIS — R52 Pain, unspecified: Secondary | ICD-10-CM | POA: Diagnosis not present

## 2019-11-05 DIAGNOSIS — R609 Edema, unspecified: Secondary | ICD-10-CM | POA: Diagnosis not present

## 2019-11-05 DIAGNOSIS — M1990 Unspecified osteoarthritis, unspecified site: Secondary | ICD-10-CM | POA: Diagnosis not present

## 2019-11-05 NOTE — Telephone Encounter (Signed)
Patient was contacted by [employer] to schedule f/u appt for leg pain but was transferred to Triage RN. Patient states that his leg pain is so severe he cannot bear any weight on it. He states he will need 2 people to help him down his stairs in order to be transported, however his stairs are too narrow to allow anyone to assist him. Dr. Shon Baton advised patient to be see in ED on 11/02/2019 to r/o DVT vs cellulitis vs fx. Patient will contact emergency services now to be transported to ED for evaluation. Hubbard Hartshorn, BSN, RN-BC

## 2019-11-05 NOTE — Telephone Encounter (Signed)
This sounds good. Thank you.

## 2019-11-05 NOTE — ED Triage Notes (Signed)
Pt arrived by [facility] from home. Reports bilateral foot pain and swelling. Pain increases when ambulating. Denies injury. No distress noted at triage.

## 2019-11-05 NOTE — Progress Notes (Signed)
Internal Medicine Clinic Attending  I evaluated the patient.  I personally confirmed the key portions of the history and exam documented by Dr. Shon Baton and I reviewed pertinent patient test results.  The assessment, diagnosis, and plan were formulated together and I agree with the documentation in the resident's note.

## 2019-11-06 ENCOUNTER — Other Ambulatory Visit: Payer: Medicare HMO

## 2019-11-06 LAB — BASIC METABOLIC PANEL
Anion gap: 14 (ref 5–15)
BUN: 27 mg/dL — ABNORMAL HIGH (ref 8–23)
CO2: 20 mmol/L — ABNORMAL LOW (ref 22–32)
Calcium: 10.9 mg/dL — ABNORMAL HIGH (ref 8.9–10.3)
Chloride: 102 mmol/L (ref 98–111)
Creatinine, Ser: 1.45 mg/dL — ABNORMAL HIGH (ref 0.61–1.24)
GFR calc Af Amer: 55 mL/min — ABNORMAL LOW (ref >60)
GFR calc non Af Amer: 47 mL/min — ABNORMAL LOW (ref >60)
Glucose, Bld: 101 mg/dL — ABNORMAL HIGH (ref 70–99)
Potassium: 3.7 mmol/L (ref 3.5–5.1)
Sodium: 136 mmol/L (ref 135–145)

## 2019-11-06 MED ORDER — ACETAMINOPHEN 500 MG PO TABS
1000.0000 mg | ORAL_TABLET | Freq: Once | ORAL | Status: AC
  Administered 2019-11-06: 1000 mg via ORAL
  Filled 2019-11-06: qty 2

## 2019-11-06 MED ORDER — PREDNISONE 20 MG PO TABS
40.0000 mg | ORAL_TABLET | Freq: Once | ORAL | Status: AC
  Administered 2019-11-06: 40 mg via ORAL
  Filled 2019-11-06: qty 2

## 2019-11-06 MED ORDER — PREDNISONE 20 MG PO TABS: ORAL_TABLET | ORAL | 0 refills | Status: DC

## 2019-11-06 NOTE — Discharge Instructions (Addendum)
It was our pleasure to provide your ER care today - we hope that you feel better.  Take prednisone as prescribed. Take acetaminophen as need for pain.  Continue to avoid nsaid meds such as ibuprofen/motrin or naprosyn/aleve. Drink plenty of fluids.   Follow up with primary care doctor in 1 week. Also follow up for outpatient MRI and follow up for left lower leg mass as previously arranged.   Return to ER if worse, new symptoms, fevers, increased joint swelling and/or redness, severe or intractable pain, or other concern.

## 2019-11-06 NOTE — ED Notes (Addendum)
Pt called 3 x for vs no answer
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
## 2019-11-06 NOTE — ED Notes (Signed)
Called pt x2 for vitals, no response. °

## 2019-11-07 NOTE — Progress Notes (Signed)
Internal Medicine Clinic Attending  I saw and evaluated the patient.  I personally confirmed the key portions of the history and exam documented by Dr. Watson and I reviewed pertinent patient test results.  The assessment, diagnosis, and plan were formulated together and I agree with the documentation in the resident's note.  Aki Burdin, M.D., Ph.D.  

## 2019-11-13 ENCOUNTER — Other Ambulatory Visit: Payer: Self-pay | Admitting: Internal Medicine

## 2019-11-13 ENCOUNTER — Other Ambulatory Visit: Payer: Self-pay | Admitting: Student in an Organized Health Care Education/Training Program

## 2019-11-13 ENCOUNTER — Ambulatory Visit (HOSPITAL_COMMUNITY)
Admission: RE | Admit: 2019-11-13 | Discharge: 2019-11-13 | Disposition: A | Discharge status: Home / Self Care | Payer: Medicare HMO | Source: Ambulatory Visit | Attending: Internal Medicine | Admitting: Internal Medicine

## 2019-11-13 DIAGNOSIS — C8590 Non-Hodgkin lymphoma, unspecified, unspecified site: Secondary | ICD-10-CM | POA: Diagnosis not present

## 2019-11-13 DIAGNOSIS — R2242 Localized swelling, mass and lump, left lower limb: Secondary | ICD-10-CM

## 2019-11-13 MED ORDER — GADOBUTROL 1 MMOL/ML IV SOLN
6.8000 mL | Freq: Once | INTRAVENOUS | Status: AC | PRN
  Administered 2019-11-13: 6.8 mL via INTRAVENOUS

## 2019-11-19 ENCOUNTER — Telehealth: Payer: Self-pay | Admitting: Student

## 2019-11-19 DIAGNOSIS — R2242 Localized swelling, mass and lump, left lower limb: Secondary | ICD-10-CM

## 2019-11-19 NOTE — Progress Notes (Addendum)
Called patient and discussed MRI results of LLE subcutaneous nodule, which are concerning for malignancy and recommended biopsy. He is agreeable to referral to general surgery. Referral placed.  Also notes pain and swelling of the RLE with onset after waking yesterday. Has hardware in place from [facility] in 1997. Has not had these symptoms before. Concern for DVT given possible cancer. He is unable to get a ride today. Will message front desk to schedule appointment for this as soon as possible.  Addendum: general surgery referral denied due to location. Referred to orthopedic surgery

## 2019-11-19 NOTE — Telephone Encounter (Signed)
Please refer to message below.  Due to transportation patient is scheduled for Wednesday 11/21/2019 at 2 pm. Forwarding back to [facility].

## 2019-11-19 NOTE — Telephone Encounter (Signed)
-----   Message from Andrew Au, MD sent at 11/19/2019  8:12 AM EDT ----- Regarding: acute appointment for leg swelling Good morning,  Please schedule this patient for an appointment as soon as possible to evaluate his R leg swelling. He says he's unable to get a ride today, but probably can tomorrow.  He has had RLE swelling and pain for 2 days, in the setting of possible soft tissue malignancy of his LLE per MRI results, referral placed for general surgery to biopsy.  Thanks! Josh

## 2019-11-20 NOTE — Addendum Note (Signed)
Addended by: Andrew Au on: 11/20/2019 08:33 PM   Modules accepted: Orders

## 2019-11-21 ENCOUNTER — Encounter: Payer: Medicare HMO | Admitting: Student

## 2019-11-22 ENCOUNTER — Other Ambulatory Visit: Payer: Self-pay

## 2019-11-22 ENCOUNTER — Ambulatory Visit (INDEPENDENT_AMBULATORY_CARE_PROVIDER_SITE_OTHER): Payer: Medicare HMO | Admitting: Student

## 2019-11-22 VITALS — BP 115/70 | HR 98 | Temp 97.9°F | Wt 148.0 lb

## 2019-11-22 DIAGNOSIS — M25561 Pain in right knee: Secondary | ICD-10-CM

## 2019-11-22 DIAGNOSIS — M7989 Other specified soft tissue disorders: Secondary | ICD-10-CM | POA: Diagnosis not present

## 2019-11-22 DIAGNOSIS — M25462 Effusion, left knee: Secondary | ICD-10-CM | POA: Diagnosis not present

## 2019-11-22 DIAGNOSIS — M2540 Effusion, unspecified joint: Secondary | ICD-10-CM

## 2019-11-22 DIAGNOSIS — M25562 Pain in left knee: Secondary | ICD-10-CM

## 2019-11-22 DIAGNOSIS — M25461 Effusion, right knee: Secondary | ICD-10-CM

## 2019-11-22 DIAGNOSIS — R2242 Localized swelling, mass and lump, left lower limb: Secondary | ICD-10-CM

## 2019-11-22 LAB — SYNOVIAL FLUID, CRYSTAL: Crystals, Fluid: NONE SEEN

## 2019-11-22 MED ORDER — PREDNISONE 20 MG PO TABS: 40.0000 mg | ORAL_TABLET | Freq: Every day | ORAL | 0 refills | Status: AC

## 2019-11-22 NOTE — Progress Notes (Signed)
   CC: R foot pain  HPI:  [name] is a 74 y.o. M who presents with fiancee for right foot pain. Please refer to problem based charting for further details and assessment and plan of current problem and chronic medical conditions.   Past Medical History:  Diagnosis Date  . Arthritis   . Benign prostatic hyperplasia without lower urinary tract symptoms 06/15/2016  . BPH (benign prostatic hyperplasia)   . Cramp in limb 02/09/2018  . Edema of right lower extremity 06/15/2016  . Edema of right lower extremity 06/15/2016  . Glaucoma   . Hypertension   . Hypokalemia 11/03/2017  . Macrocytosis without anemia 01/27/2017  . Pain of right lower extremity 02/09/2018  . Primary hyperparathyroidism ([place]) 01/27/2017   Diagnosed March 2019. - PTH 108, Ca 11 (<1 over ULN), eGFR 85 (>60)  - DEXA: T: -1.1 (ostepenia not indication for surgery) - No bone pain, renal stones, constipation, or nausea - 24-hr urinary calcium not done - Supplementing Vit D   . Tachycardia 01/19/2017  . Tobacco abuse 01/26/2017  . Vision blurred 04/14/2017   Review of Systems:  Negative as per HPI  Physical Exam:  Vitals:   11/22/19 1318  BP: 115/70  Pulse: 98  Temp: 97.9 F (36.6 C)  TempSrc: Oral  SpO2: 100%  Weight: 148 lb (67.1 kg)   Physical Exam Constitutional:      Appearance: Normal appearance.  HENT:     Head: Normocephalic and atraumatic.  Cardiovascular:     Rate and Rhythm: Normal rate and regular rhythm.     Pulses: Normal pulses.     Heart sounds: Normal heart sounds.  Pulmonary:     Effort: Pulmonary effort is normal.     Breath sounds: Normal breath sounds.  Abdominal:     General: Abdomen is flat. Bowel sounds are normal.     Palpations: Abdomen is soft.  Musculoskeletal:     Comments: Swelling and warmth of right knee, right first MTP, and left knee. R knee swelling > left knee, no calf tenderness, no drainage, no edema of the lower leg. Left poster leg with nontender subcutaneous  nodule  Skin:    General: Skin is warm and dry.     Capillary Refill: Capillary refill takes less than 2 seconds.  Neurological:     General: No focal deficit present.     Mental Status: He is alert. Mental status is at baseline.       Knee Arthrocentesis with Injection Procedure Note  Diagnosis: right Knee  Indications: Diagnostic  Anesthesia: Lidocaine 1% without epinephrine  Procedure Details    Consent was obtained for the procedure. The joint was prepped with Betadine. A 25 gauge needle was inserted into the superior aspect of the joint from a lateral approach to access the suprapatellar pouch. <1 ml of clear yellow fluid was removed from the joint and sent to the lab for analysis. 1 ml 1% lidocaine. The needle was removed and the area cleansed and dressed.  Complications:  None; patient tolerated the procedure well.  Assessment & Plan:   See Encounters Tab for problem based charting.  Patient seen with Dr. Jimmye Norman

## 2019-11-22 NOTE — Patient Instructions (Addendum)
It was a pleasure seeing you in clinic. Today we discussed:   Knee pain and big toe pain: We took fluid from the joint and will send it for analysis. I have also ordered prednisone 40 mg daily. Please let us know if it is not improving to see if you need to be reevaluated or if we need to lengthen the course.  If you have any questions or concerns, please call our clinic at [phone] between 9am-5pm and after hours call [phone] and ask for the internal medicine resident on call. If you feel you are having a medical emergency please call 911.   Thank you, we look forward to helping you remain healthy!

## 2019-11-23 NOTE — Assessment & Plan Note (Signed)
Patient has appointment for orthopedics to follow up on subcutaneous nodule on 11/26/2019

## 2019-11-23 NOTE — Assessment & Plan Note (Signed)
Patient presents today for pain and swelling of the right great toe and right knee. Patient previously present with similar pain and swelling of the left ankle and knee on 11/04/2019. Later seen in ED and treated with 5 day course of prednisone. States this greatly improved the pain and swelling and he was feeling better. However 5 days ago he started having right knee pain and R great toe swelling that limited his walking due to pain. Since then the pain has improved and he is now able to walk but continues to have pain. He feels that this pain is the same as what he had in the left foot.   On exam patient is afebrile with significant R knee swelling and swelling of the right great toe. He also has a small amount of swelling of the left knee. Patient feels that the swelling is due to gout which he think he might have had in the past. He also endorses frequent drinks of beer. Not currently on thiazides and patient feels that he drinks plenty of water and denies trauma or increase red meat consumptom. Given recurrent joint pain and swelling in large joints of knee and large toe and frequent alcohol use presentation is concerning for gout. R knee was aspirated and a small amount of clear yellow fluid was drained and sent for crystal analysis.  Plan - Prednisone 40 mg for 5 days, patient will call if swelling does not improve or if swelling persists after finishing steroids - Joint fluid crystal analysis - Advised patient to limit how much beer he drinks

## 2019-11-26 ENCOUNTER — Encounter: Payer: Self-pay | Admitting: Orthopedic Surgery

## 2019-11-26 ENCOUNTER — Ambulatory Visit (INDEPENDENT_AMBULATORY_CARE_PROVIDER_SITE_OTHER): Payer: Medicare HMO | Admitting: Orthopedic Surgery

## 2019-11-26 ENCOUNTER — Other Ambulatory Visit (INDEPENDENT_AMBULATORY_CARE_PROVIDER_SITE_OTHER): Payer: Self-pay | Admitting: Orthopedic Surgery

## 2019-11-26 VITALS — Ht 71.0 in | Wt 148.0 lb

## 2019-11-26 DIAGNOSIS — R2242 Localized swelling, mass and lump, left lower limb: Secondary | ICD-10-CM | POA: Diagnosis not present

## 2019-11-26 DIAGNOSIS — C499 Malignant neoplasm of connective and soft tissue, unspecified: Secondary | ICD-10-CM | POA: Diagnosis not present
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
## 2019-11-28 DIAGNOSIS — M8580 Other specified disorders of bone density and structure, unspecified site: Secondary | ICD-10-CM | POA: Diagnosis not present

## 2019-11-28 DIAGNOSIS — E210 Primary hyperparathyroidism: Secondary | ICD-10-CM | POA: Diagnosis not present

## 2019-11-28 NOTE — Progress Notes (Signed)
Internal Medicine Clinic Attending  I saw and evaluated the patient.  I personally confirmed the key portions of the history and exam documented by Dr. Lisabeth Devoid and I reviewed pertinent patient test results and assisted with the R knee arthrocentesis.  The assessment, diagnosis, and plan were formulated together and I agree with the documentation in the resident's note. Clinically the presentation is consistent with gout or pseudogout.

## 2019-11-29 ENCOUNTER — Telehealth: Payer: Self-pay | Admitting: Orthopedic Surgery

## 2019-11-29 ENCOUNTER — Telehealth: Payer: Self-pay

## 2019-11-29 ENCOUNTER — Other Ambulatory Visit: Payer: Self-pay

## 2019-11-29 DIAGNOSIS — C499 Malignant neoplasm of connective and soft tissue, unspecified: Secondary | ICD-10-CM

## 2019-11-29 NOTE — Telephone Encounter (Signed)
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  Final results not available yet I called the patient and informed him of these findings and we will make a stat referral to [facility] for orthopedic oncology.  I told the patient once I had a final pathology report I would provide him with this information as well.

## 2019-11-29 NOTE — Telephone Encounter (Signed)
Received a call from pathology center. 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 They will fax report to [place].   Advised Dr. Sharol Given. He would like for [place] to send Urgent referral to [facility].  Referral made

## 2019-12-03 ENCOUNTER — Ambulatory Visit: Payer: Medicare HMO | Admitting: Orthopedic Surgery

## 2019-12-03 ENCOUNTER — Other Ambulatory Visit (HOSPITAL_COMMUNITY): Payer: Self-pay | Admitting: Surgery

## 2019-12-03 ENCOUNTER — Other Ambulatory Visit: Payer: Self-pay | Admitting: Surgery

## 2019-12-03 DIAGNOSIS — E210 Primary hyperparathyroidism: Secondary | ICD-10-CM

## 2019-12-03 DIAGNOSIS — M8580 Other specified disorders of bone density and structure, unspecified site: Secondary | ICD-10-CM | POA: Diagnosis not present

## 2019-12-10 DIAGNOSIS — H2513 Age-related nuclear cataract, bilateral: Secondary | ICD-10-CM | POA: Diagnosis not present

## 2019-12-10 DIAGNOSIS — H401133 Primary open-angle glaucoma, bilateral, severe stage: Secondary | ICD-10-CM | POA: Diagnosis not present

## 2019-12-10 NOTE — Progress Notes (Signed)
CC: LLE nodule, AKI, HTN, BPH  HPI:  Bryan Vincent is a 74 y.o. male with history as below presenting for follow up of the above issues. Please refer to problem based charting for further details of assessment and plan of current problem and chronic medical conditions.  Past Medical History:  Diagnosis Date  . Arthritis   . Benign prostatic hyperplasia without lower urinary tract symptoms 06/15/2016  . BPH (benign prostatic hyperplasia)   . Cramp in limb 02/09/2018  . Edema of right lower extremity 06/15/2016  . Edema of right lower extremity 06/15/2016  . Glaucoma   . Hypertension   . Hypokalemia 11/03/2017  . Macrocytosis without anemia 01/27/2017  . Pain of right lower extremity 02/09/2018  . Primary hyperparathyroidism ([facility]) 01/27/2017   Diagnosed March 2019. - PTH 108, Ca 11 (<1 over ULN), eGFR 85 (>60)  - DEXA: T: -1.1 (ostepenia not indication for surgery) - No bone pain, renal stones, constipation, or nausea - 24-hr urinary calcium not done - Supplementing Vit D   . Tachycardia 01/19/2017  . Tobacco abuse 01/26/2017  . Vision blurred 04/14/2017   Review of Systems:   Review of Systems  Constitutional: Positive for weight loss. Negative for chills and fever.  Eyes: Negative for blurred vision and double vision.  Respiratory: Positive for cough and shortness of breath.   Cardiovascular: Negative for chest pain and palpitations.  Gastrointestinal: Negative for nausea and vomiting.  Genitourinary: Negative for dysuria and frequency.  Musculoskeletal: Negative for joint pain.  Neurological: Negative for dizziness and loss of consciousness.  All other systems reviewed and are negative.    Physical Exam: Vitals:   12/11/19 0847  BP: 115/64  Pulse: 70  Temp: 97.6 F (36.4 C)  TempSrc: Oral  SpO2: 98%  Weight: 149 lb 11.2 oz (67.9 kg)  Height: 5' 11" (1.803 m)   Constitutional: no acute distress Head: atraumatic ENT: external ears normal Cardiovascular: regular rate  and rhythm, normal heart sounds Pulmonary: effort normal, diffuse wheezing which resolved after albuterol neb Abdominal: flat, nontender, no rebound tenderness, bowel sounds normal Musculoskeletal: No knee swelling Skin: warm and dry Neurological: alert, no focal deficit Psychiatric: normal mood and affect  Assessment & Plan:   See Encounters Tab for problem based charting.  Patient seen with Dr. Hoffman  

## 2019-12-11 ENCOUNTER — Other Ambulatory Visit: Payer: Self-pay

## 2019-12-11 ENCOUNTER — Ambulatory Visit (INDEPENDENT_AMBULATORY_CARE_PROVIDER_SITE_OTHER): Payer: Medicare HMO | Admitting: Student

## 2019-12-11 ENCOUNTER — Ambulatory Visit: Payer: Self-pay

## 2019-12-11 ENCOUNTER — Encounter: Payer: Self-pay | Admitting: Student

## 2019-12-11 VITALS — BP 115/64 | HR 70 | Temp 97.6°F | Ht 71.0 in | Wt 149.7 lb

## 2019-12-11 DIAGNOSIS — R062 Wheezing: Secondary | ICD-10-CM | POA: Insufficient documentation

## 2019-12-11 DIAGNOSIS — E210 Primary hyperparathyroidism: Secondary | ICD-10-CM

## 2019-12-11 DIAGNOSIS — M79604 Pain in right leg: Secondary | ICD-10-CM

## 2019-12-11 DIAGNOSIS — N179 Acute kidney failure, unspecified: Secondary | ICD-10-CM

## 2019-12-11 DIAGNOSIS — N401 Enlarged prostate with lower urinary tract symptoms: Secondary | ICD-10-CM | POA: Diagnosis not present

## 2019-12-11 DIAGNOSIS — R2242 Localized swelling, mass and lump, left lower limb: Secondary | ICD-10-CM

## 2019-12-11 DIAGNOSIS — C4922 Malignant neoplasm of connective and soft tissue of left lower limb, including hip: Secondary | ICD-10-CM | POA: Diagnosis not present

## 2019-12-11 DIAGNOSIS — R350 Frequency of micturition: Secondary | ICD-10-CM

## 2019-12-11 DIAGNOSIS — M2540 Effusion, unspecified joint: Secondary | ICD-10-CM

## 2019-12-11 DIAGNOSIS — N400 Enlarged prostate without lower urinary tract symptoms: Secondary | ICD-10-CM

## 2019-12-11 DIAGNOSIS — D7589 Other specified diseases of blood and blood-forming organs: Secondary | ICD-10-CM | POA: Diagnosis not present

## 2019-12-11 DIAGNOSIS — I10 Essential (primary) hypertension: Secondary | ICD-10-CM

## 2019-12-11 MED ORDER — CARVEDILOL 6.25 MG PO TABS: 6.2500 mg | ORAL_TABLET | Freq: Two times a day (BID) | ORAL | 3 refills | Status: AC

## 2019-12-11 MED ORDER — ATORVASTATIN CALCIUM 20 MG PO TABS: 20.0000 mg | ORAL_TABLET | Freq: Every day | ORAL | 3 refills | Status: AC

## 2019-12-11 MED ORDER — VITAMIN B-12 1000 MCG PO TABS: 1000.0000 ug | ORAL_TABLET | Freq: Every day | ORAL | 3 refills | Status: DC

## 2019-12-11 MED ORDER — ALBUTEROL SULFATE HFA 108 (90 BASE) MCG/ACT IN AERS: 2.0000 | INHALATION_SPRAY | Freq: Four times a day (QID) | RESPIRATORY_TRACT | 0 refills | Status: AC | PRN

## 2019-12-11 MED ORDER — AMLODIPINE BESYLATE 10 MG PO TABS: 10.0000 mg | ORAL_TABLET | Freq: Every day | ORAL | 3 refills | Status: AC

## 2019-12-11 MED ORDER — CINACALCET HCL 30 MG PO TABS: 30.0000 mg | ORAL_TABLET | Freq: Two times a day (BID) | ORAL | 3 refills | Status: DC

## 2019-12-11 MED ORDER — TAMSULOSIN HCL 0.4 MG PO CAPS: 0.4000 mg | ORAL_CAPSULE | Freq: Every day | ORAL | 3 refills | Status: AC

## 2019-12-11 MED ORDER — VITAMIN D 50 MCG (2000 UT) PO TABS: 2000.0000 [IU] | ORAL_TABLET | Freq: Every day | ORAL | 3 refills | Status: AC

## 2019-12-11 MED ORDER — ALBUTEROL SULFATE (2.5 MG/3ML) 0.083% IN NEBU
2.5000 mg | INHALATION_SOLUTION | Freq: Once | RESPIRATORY_TRACT | Status: AC
  Administered 2019-12-11: 2.5 mg via RESPIRATORY_TRACT

## 2019-12-11 MED ORDER — VITAMIN D (ERGOCALCIFEROL) 1.25 MG (50000 UNIT) PO CAPS: 50000.0000 [IU] | ORAL_CAPSULE | ORAL | 3 refills | Status: AC

## 2019-12-11 NOTE — Patient Instructions (Addendum)
Thank you for allowing [place] to be a part of your care today, it was a pleasure seeing you. We discussed your sarcoma, kidney function, hypertension, cough, and BPH.  I am checking these labs: BMP  I have made these changes to your medications: None (removed finasteride from your medication list as it was stopped by the ED)  Please call [employer] and see if they will provide transportation to your appointments at [facility]. Try to make an appointment as soon as you can. I will also have our social workers contact you regarding transportation.  Your blood pressure looks good, we will continue the same medications.  Your kidney function is improving. I will check the function today to ensure it is still going in the right direction.  I gave you a breathing treatment with albuterol to help with the wheezing. I also prescribed an inhaler with the same medication. If this returns or worsens, please call and let us know.  Please follow up in 6 months   Thank you, and please call the Internal Medicine Clinic at [phone] if you have any questions.  Best, Dr. Bridgett Larsson

## 2019-12-11 NOTE — Addendum Note (Signed)
Addended by: Andrew Au on: 12/11/2019 01:10 PM   Modules accepted: Level of Service

## 2019-12-11 NOTE — Assessment & Plan Note (Signed)
Complaining of mild cough that produces clear sputum over past few days, SOB, and wheezing. Active smoker but no known hx of COPD. No fever, chills, confusion, lightheadedness. On exam has moderate wheezing diffusely. Gave albuterol neb here with resolution of wheezing. Likely has a vital URI currently.   -prescribed 1 albuterol inhaler -if continued SOB and wheezing continue after URI resolved, get PFTs for formal COPD workup

## 2019-12-11 NOTE — Assessment & Plan Note (Signed)
Drained by Dr. Lisabeth Devoid on 10/14 and started on course of steroids. Fluid analysis showed no crystals. He states the pain has resolved at this time.

## 2019-12-11 NOTE — Assessment & Plan Note (Addendum)
Was seen on 9/14 in our clinic for creatinine elevation to 2.28, which has since then improved progressively > 1.77 > 1.45 on 9/28. Valsartan and NSAIDs held for nephrotoxic potential, and valsartan also for possible pre-renal etiology.   Workup so far: FENa of 0.2% suggesting pre-renal etiology.  Urinalysis unremarkable.  UPC ratio of 47.  Renal ultrasound ordered but not collected yet.  SPEP not collected yet.  Serum FLC showed mild elevation in IgG kappa free light chain and Ig Lambda free light chain. Consistent with overall inflammation. Serum immunofixation showed mild elevation in IgA to 477.  Plan: -repeat BMP today -if benign renal function, cancel renal ultrasound as disease is resolving -continue holding valsartan as he is normotensive  Addendum: -BMP with creatinine improved  2.28 > 1.77 > 1.45 > 1.36 Seems it is leveling off, so may reflect a new baseline for him. -repeat BMP at next visit in 6 months

## 2019-12-11 NOTE — Assessment & Plan Note (Addendum)
Continued on tamsulosin 0.4mg . Finasteride was discontinued by ED doctor, unclear why from documentation. Reports his urination is not an issue at this time so will not make further changes.  -continue tamsulosin 0.4mg 

## 2019-12-11 NOTE — Assessment & Plan Note (Signed)
Ultrasound and MRI were concerning, so referred to orthopedic surgery for biopsy. 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 Wa referred by ortho to ortho oncology at [facility]. Reports that he will have difficulty with transportation and has not yet made an appointment yet for that reason.  -patient will call [facility] Medicare and see if they can help with transport -social work referral to ensure he gets transportation

## 2019-12-11 NOTE — Chronic Care Management (AMB) (Signed)
  Chronic Care Management    12/11/2019 Name: Chayne Baumgart MRN: [id number] DOB: May 03, 1945  Referred by: Andrew Au, MD Reason for referral : Care Coordination (transportation )  Met with patient, per provider request, to discuss options for transportation to [facility] as patient's spouse does not feel comfortable driving outside of [place].   Unfortunately, [employer] [place] does not provide county to county transportation.  Patient and spouse state that he does have transportation benefit through [facility] but have not needed to use if before now. Encourage patient/spouse to contact [place] to confirm benefit, number of rides, and how to go about scheduling.    Ronn Melena, [place] Coordination Social Worker [place] [phone]

## 2019-12-11 NOTE — Assessment & Plan Note (Signed)
BP of 115/64 today. Denies chest pain, palpitations, dizziness, headaches, LOC. Valsartan recently stopped due to pre-renal AKI. Previously had lightheadedness which resolved.  -continue amlodipine 10mg  -continue carvedilol 6.25mg  BID -continue holding valsartan as he is normotensive

## 2019-12-12 ENCOUNTER — Encounter: Payer: Medicare HMO | Admitting: Student

## 2019-12-12 LAB — BMP8+ANION GAP
Anion Gap: 16 mmol/L (ref 10.0–18.0)
BUN/Creatinine Ratio: 10 (ref 10–24)
BUN: 14 mg/dL (ref 8–27)
CO2: 18 mmol/L — ABNORMAL LOW (ref 20–29)
Calcium: 9 mg/dL (ref 8.6–10.2)
Chloride: 107 mmol/L — ABNORMAL HIGH (ref 96–106)
Creatinine, Ser: 1.36 mg/dL — ABNORMAL HIGH (ref 0.76–1.27)
GFR calc Af Amer: 59 mL/min/{1.73_m2} — ABNORMAL LOW (ref >59)
GFR calc non Af Amer: 51 mL/min/{1.73_m2} — ABNORMAL LOW (ref >59)
Glucose: 88 mg/dL (ref 65–99)
Potassium: 3.9 mmol/L (ref 3.5–5.2)
Sodium: 141 mmol/L (ref 134–144)

## 2019-12-12 NOTE — Addendum Note (Signed)
Addended by: Andrew Au on: 12/12/2019 08:42 AM   Modules accepted: Orders

## 2019-12-13 NOTE — Addendum Note (Signed)
Addended by: Truddie Crumble on: 12/13/2019 03:23 PM   Modules accepted: Orders

## 2019-12-14 ENCOUNTER — Telehealth: Payer: Self-pay

## 2019-12-14 ENCOUNTER — Ambulatory Visit: Payer: Medicare HMO

## 2019-12-14 DIAGNOSIS — I10 Essential (primary) hypertension: Secondary | ICD-10-CM

## 2019-12-14 DIAGNOSIS — E210 Primary hyperparathyroidism: Secondary | ICD-10-CM

## 2019-12-14 DIAGNOSIS — N179 Acute kidney failure, unspecified: Secondary | ICD-10-CM

## 2019-12-14 NOTE — Telephone Encounter (Signed)
  Chronic Care Management   Outreach Note  12/14/2019 Name: Bryan Vincent MRN: [id number] DOB: 03/05/1945  Referred by: Andrew Au, MD Reason for referral : Care Coordination (transportation )  Incoming call from patient's significant other stating they have been unable to arrange transportation via [facility] to upcoming appointment at [facility] because they are requesting information that she/patient do not have.  Information needed is reason for appointment, address of facility, and provider that patient is scheduled to see.  Directed significant other to contact provider office to gather this information but she was not at home during time of call and stated that transportation has to be arranged today to meet deadline.   [place] to gather needed information.  Called significant other back and informed her that reason for referral is mass on left leg, address of facility is [place]. Rondall Allegra [phone] and patient is to see Dr. Mylo Red.    Ronn Melena, [place] Coordination Social Worker [place] [phone]

## 2019-12-14 NOTE — Chronic Care Management (AMB) (Signed)
  Chronic Care Management   Outreach Note  12/14/2019 Name: Abyan Cadman MRN: [id number] DOB: [birth year]  Referred by: Andrew Au, MD Reason for referral : Care Coordination (transportation )  Follow up call to patient today regarding need for transportation.  Patient states that wife has been unable to connect with [facility] Customer Service to confirm benefit.  CCM BSW called on behalf of patient.  Representative states that patient has total of 24 rides within a 50 mile radius of patient's home.  Called patient back and provided him with number for reservations [phone].  Informed patient that rides must be scheduled within three business days of appointment.    Ronn Melena, [place] Coordination Social Worker [place] [phone]

## 2019-12-17 NOTE — Progress Notes (Addendum)
Internal Medicine Clinic Resident  I have personally reviewed this encounter including the documentation in this note and/or discussed this patient with the care management provider. I will address any urgent items identified by the care management provider and will communicate my actions to the patient's PCP. I have reviewed the patient's CCM visit with my supervising attending, Dr Rebeca Alert.  Andrew Au, MD 12/17/2019

## 2019-12-18 NOTE — Progress Notes (Signed)
Internal Medicine Clinic Attending  CCM services provided by the care management provider and their documentation were reviewed with Dr. Bridgett Larsson.  We reviewed the pertinent findings, urgent action items addressed by the resident and non-urgent items to be addressed by the PCP.  I agree with the assessment, diagnosis, and plan of care documented in the CCM and resident's note.  Oda Kilts, MD 12/18/2019

## 2019-12-18 NOTE — Telephone Encounter (Signed)
Internal Medicine Clinic Attending  CCM services provided by the care management provider and their documentation were reviewed with Dr. Bridgett Larsson.  We reviewed the pertinent findings, urgent action items addressed by the resident and non-urgent items to be addressed by the PCP.  I agree with the assessment, diagnosis, and plan of care documented in the CCM and resident's note.  Oda Kilts, MD 12/18/2019

## 2019-12-19 ENCOUNTER — Other Ambulatory Visit: Payer: Self-pay

## 2019-12-19 ENCOUNTER — Ambulatory Visit (HOSPITAL_COMMUNITY)
Admission: RE | Admit: 2019-12-19 | Discharge: 2019-12-19 | Disposition: A | Discharge status: Home / Self Care | Payer: Medicare HMO | Source: Ambulatory Visit | Attending: Surgery | Admitting: Surgery

## 2019-12-19 ENCOUNTER — Encounter (HOSPITAL_COMMUNITY)
Admission: RE | Admit: 2019-12-19 | Discharge: 2019-12-19 | Disposition: A | Discharge status: Home / Self Care | Payer: Medicare HMO | Source: Ambulatory Visit | Attending: Surgery | Admitting: Surgery

## 2019-12-19 DIAGNOSIS — E213 Hyperparathyroidism, unspecified: Secondary | ICD-10-CM | POA: Diagnosis not present

## 2019-12-19 DIAGNOSIS — E210 Primary hyperparathyroidism: Secondary | ICD-10-CM | POA: Insufficient documentation

## 2019-12-19 DIAGNOSIS — E041 Nontoxic single thyroid nodule: Secondary | ICD-10-CM | POA: Diagnosis not present

## 2019-12-19 MED ORDER — TECHNETIUM TC 99M SESTAMIBI GENERIC - CARDIOLITE
26.0000 | Freq: Once | INTRAVENOUS | Status: AC | PRN
  Administered 2019-12-19: 26 via INTRAVENOUS

## 2019-12-20 NOTE — Progress Notes (Signed)
Internal Medicine Clinic Attending  I saw and evaluated the patient.  I personally confirmed the key portions of the history and exam documented by Dr. Chen and I reviewed pertinent patient test results.  The assessment, diagnosis, and plan were formulated together and I agree with the documentation in the resident's note.  

## 2019-12-25 ENCOUNTER — Other Ambulatory Visit: Payer: Self-pay | Admitting: Surgery

## 2019-12-25 DIAGNOSIS — E210 Primary hyperparathyroidism: Secondary | ICD-10-CM

## 2020-01-01 DIAGNOSIS — F17210 Nicotine dependence, cigarettes, uncomplicated: Secondary | ICD-10-CM | POA: Diagnosis not present

## 2020-01-01 DIAGNOSIS — D518 Other vitamin B12 deficiency anemias: Secondary | ICD-10-CM | POA: Diagnosis not present

## 2020-01-01 DIAGNOSIS — R6889 Other general symptoms and signs: Secondary | ICD-10-CM | POA: Diagnosis not present

## 2020-01-01 DIAGNOSIS — C4922 Malignant neoplasm of connective and soft tissue of left lower limb, including hip: Secondary | ICD-10-CM | POA: Diagnosis not present

## 2020-01-01 DIAGNOSIS — J90 Pleural effusion, not elsewhere classified: Secondary | ICD-10-CM | POA: Diagnosis not present

## 2020-01-01 DIAGNOSIS — R2242 Localized swelling, mass and lump, left lower limb: Secondary | ICD-10-CM | POA: Diagnosis not present

## 2020-01-14 ENCOUNTER — Inpatient Hospital Stay: Admission: RE | Admit: 2020-01-14 | Payer: Medicare HMO | Source: Ambulatory Visit

## 2020-01-14 ENCOUNTER — Other Ambulatory Visit: Payer: Self-pay

## 2020-01-14 ENCOUNTER — Ambulatory Visit
Admission: RE | Admit: 2020-01-14 | Discharge: 2020-01-14 | Disposition: A | Discharge status: Home / Self Care | Payer: Medicare HMO | Source: Ambulatory Visit | Attending: Surgery | Admitting: Surgery

## 2020-01-14 DIAGNOSIS — E210 Primary hyperparathyroidism: Secondary | ICD-10-CM

## 2020-01-14 MED ORDER — IOPAMIDOL (ISOVUE-300) INJECTION 61%
75.0000 mL | Freq: Once | INTRAVENOUS | Status: AC | PRN
  Administered 2020-01-14: 75 mL via INTRAVENOUS

## 2020-01-29 ENCOUNTER — Ambulatory Visit: Payer: Medicare HMO | Admitting: Podiatry

## 2020-01-30 DIAGNOSIS — I739 Peripheral vascular disease, unspecified: Secondary | ICD-10-CM | POA: Diagnosis not present

## 2020-01-30 DIAGNOSIS — T8131XA Disruption of external operation (surgical) wound, not elsewhere classified, initial encounter: Secondary | ICD-10-CM | POA: Diagnosis not present

## 2020-01-31 DIAGNOSIS — C4922 Malignant neoplasm of connective and soft tissue of left lower limb, including hip: Secondary | ICD-10-CM | POA: Diagnosis not present

## 2020-01-31 DIAGNOSIS — R6889 Other general symptoms and signs: Secondary | ICD-10-CM | POA: Diagnosis not present

## 2020-01-31 DIAGNOSIS — I96 Gangrene, not elsewhere classified: Secondary | ICD-10-CM | POA: Diagnosis not present

## 2020-02-09 DIAGNOSIS — I739 Peripheral vascular disease, unspecified: Secondary | ICD-10-CM | POA: Diagnosis not present

## 2020-02-09 DIAGNOSIS — T8131XA Disruption of external operation (surgical) wound, not elsewhere classified, initial encounter: Secondary | ICD-10-CM | POA: Diagnosis not present

## 2020-03-01 DIAGNOSIS — I739 Peripheral vascular disease, unspecified: Secondary | ICD-10-CM | POA: Diagnosis not present

## 2020-03-03 ENCOUNTER — Ambulatory Visit: Payer: Medicare HMO | Admitting: *Deleted

## 2020-03-03 DIAGNOSIS — I10 Essential (primary) hypertension: Secondary | ICD-10-CM

## 2020-03-03 DIAGNOSIS — E210 Primary hyperparathyroidism: Secondary | ICD-10-CM

## 2020-03-03 NOTE — Chronic Care Management (AMB) (Signed)
   03/03/2020  Bryan Vincent 1946-01-12 [id number]  Patient called clinic requesting to speak with former clinic social worker Amber Chrismon. Patient is requesting number for transportation services for his [facility] health plan. Provided patient with the following information per Amber's note of 12/14/19 "Representative states that patient has total of 24 rides within a 50 mile radius of patient's home, phone number for reservations [phone].  Informed patient that rides must be scheduled within three business days of appointment." Also suggested patient call the customer service number on his [facility] Medicare card for further assistance with transportation questions.   Kelli Churn RN, CCM, [facility] RN Care Manager [phone]

## 2020-03-13 DIAGNOSIS — R2681 Unsteadiness on feet: Secondary | ICD-10-CM | POA: Diagnosis not present

## 2020-03-13 DIAGNOSIS — F17210 Nicotine dependence, cigarettes, uncomplicated: Secondary | ICD-10-CM | POA: Diagnosis not present

## 2020-03-13 DIAGNOSIS — N189 Chronic kidney disease, unspecified: Secondary | ICD-10-CM | POA: Diagnosis not present

## 2020-03-13 DIAGNOSIS — C4922 Malignant neoplasm of connective and soft tissue of left lower limb, including hip: Secondary | ICD-10-CM | POA: Diagnosis not present

## 2020-03-13 DIAGNOSIS — R6889 Other general symptoms and signs: Secondary | ICD-10-CM | POA: Diagnosis not present

## 2020-03-13 DIAGNOSIS — I129 Hypertensive chronic kidney disease with stage 1 through stage 4 chronic kidney disease, or unspecified chronic kidney disease: Secondary | ICD-10-CM | POA: Diagnosis not present

## 2020-03-13 DIAGNOSIS — E785 Hyperlipidemia, unspecified: Secondary | ICD-10-CM | POA: Diagnosis not present

## 2020-03-13 DIAGNOSIS — E212 Other hyperparathyroidism: Secondary | ICD-10-CM | POA: Diagnosis not present

## 2020-03-14 DIAGNOSIS — C4922 Malignant neoplasm of connective and soft tissue of left lower limb, including hip: Secondary | ICD-10-CM | POA: Diagnosis not present

## 2020-03-14 DIAGNOSIS — E212 Other hyperparathyroidism: Secondary | ICD-10-CM | POA: Diagnosis not present

## 2020-03-14 DIAGNOSIS — E785 Hyperlipidemia, unspecified: Secondary | ICD-10-CM | POA: Diagnosis not present

## 2020-03-14 DIAGNOSIS — R2681 Unsteadiness on feet: Secondary | ICD-10-CM | POA: Diagnosis not present

## 2020-03-14 DIAGNOSIS — I739 Peripheral vascular disease, unspecified: Secondary | ICD-10-CM | POA: Diagnosis not present

## 2020-03-14 DIAGNOSIS — N189 Chronic kidney disease, unspecified: Secondary | ICD-10-CM | POA: Diagnosis not present

## 2020-03-14 DIAGNOSIS — I129 Hypertensive chronic kidney disease with stage 1 through stage 4 chronic kidney disease, or unspecified chronic kidney disease: Secondary | ICD-10-CM | POA: Diagnosis not present

## 2020-03-19 DIAGNOSIS — R6889 Other general symptoms and signs: Secondary | ICD-10-CM | POA: Diagnosis not present

## 2020-03-30 DIAGNOSIS — I739 Peripheral vascular disease, unspecified: Secondary | ICD-10-CM | POA: Diagnosis not present

## 2020-05-13 DIAGNOSIS — C4922 Malignant neoplasm of connective and soft tissue of left lower limb, including hip: Secondary | ICD-10-CM | POA: Diagnosis not present

## 2020-05-13 DIAGNOSIS — Z945 Skin transplant status: Secondary | ICD-10-CM | POA: Diagnosis not present

## 2020-05-13 DIAGNOSIS — Z483 Aftercare following surgery for neoplasm: Secondary | ICD-10-CM | POA: Diagnosis not present

## 2020-05-27 ENCOUNTER — Encounter: Payer: Self-pay | Admitting: *Deleted

## 2020-05-27 NOTE — Progress Notes (Unsigned)

## 2020-06-06 NOTE — Progress Notes (Unsigned)
Things That May Be Affecting Your Health:  Alcohol  Hearing loss x Pain    Depression  Home Safety  Sexual Health   Diabetes  Lack of physical activity  Stress   Difficulty with daily activities  Loneliness  Tiredness   Drug use  Medicines x Tobacco use   Falls  Motor Vehicle Safety  Weight   Food choices  Oral Health  Other    YOUR PERSONALIZED HEALTH PLAN : 1. Schedule your next subsequent Medicare Wellness visit in one year 2. Attend all of your regular appointments to address your medical issues 3. Complete the preventative screenings and services   Annual Wellness Visit   Medicare Covered Preventative Screenings and [place] Men and Women Who How Often Need? Date of Last Service Action  Abdominal Aortic Aneurysm Adults with AAA risk factors Once x     Alcohol Misuse and Counseling All Adults Screening once a year if no alcohol misuse. Counseling up to 4 face to face sessions.     Bone Density Measurement  Adults at risk for osteoporosis Once every 2 yrs      Lipid Panel Z13.6 All adults without CV disease Once every 5 yrs       Colorectal Cancer   Stool sample or  Colonoscopy All adults 50 and older   Once every year  Every 10 years        Depression All Adults Once a year  Today   Diabetes Screening Blood glucose, post glucose load, or GTT Z13.1  All adults at risk  Pre-diabetics  Once per year  Twice per year      Diabetes  Self-Management Training All adults Diabetics 10 hrs first year; 2 hours subsequent years. Requires Copay     Glaucoma  Diabetics  Family history of glaucoma  African Americans 65 yrs +  Hispanic Americans 22 yrs + Annually - requires coppay      Hepatitis C Z72.89 or F19.20  High Risk for HCV  Born between 1945 and 1965  Annually  Once      HIV Z11.4 All adults based on risk  Annually btw ages 60 & 73 regardless of risk  Annually > 65 yrs if at increased risk      Lung Cancer Screening  Asymptomatic adults aged 31-77 with 30 pack yr history and current smoker OR quit within the last 15 yrs Annually Must have counseling and shared decision making documentation before first screen x     Medical Nutrition Therapy Adults with   Diabetes  Renal disease  Kidney transplant within past 3 yrs 3 hours first year; 2 hours subsequent years     Obesity and Counseling All adults Screening once a year Counseling if BMI 30 or higher  Today   Tobacco Use Counseling Adults who use tobacco  Up to 8 visits in one year     Vaccines Z23  Hepatitis B  Influenza   Pneumonia  Adults   Once  Once every flu season  Two different vaccines separated by one year     Next Annual Wellness Visit People with Medicare Every year  Today     Services & Screenings Women Who How Often Need  Date of Last Service Action  Mammogram  Z12.31 Women over 68 One baseline ages 57-39. Annually ager 40 yrs+      Pap tests All women Annually if high risk. Every 2 yrs for normal risk women  Screening for cervical cancer with   Pap (Z01.419 nl or Z01.411abnl) &  HPV Z11.51 Women aged 29 to 58 Once every 5 yrs     Screening pelvic and breast exams All women Annually if high risk. Every 2 yrs for normal risk women     Sexually Transmitted Diseases  Chlamydia  Gonorrhea  Syphilis All at risk adults Annually for non pregnant females at increased risk         [place] Men Who How Ofter Need  Date of Last Service Action  Prostate Cancer - DRE & PSA Men over 50 Annually.  DRE might require a copay. x       Sexually Transmitted Diseases  Syphilis All at risk adults Annually for men at increased risk      Health Maintenance List Health Maintenance  Topic Date Due  . COVID-19 Vaccine (3 - Pfizer risk 4-dose series) 07/10/2019  . INFLUENZA VACCINE  09/08/2020  . COLONOSCOPY (Pts 45-74yrs Insurance coverage will need to be confirmed)  04/04/2021  . TETANUS/TDAP  02/10/2028   . Hepatitis C Screening  Completed  . PNA vac Low Risk Adult  Completed  . HPV VACCINES  Aged Out

## 2020-06-09 DIAGNOSIS — C4922 Malignant neoplasm of connective and soft tissue of left lower limb, including hip: Secondary | ICD-10-CM | POA: Diagnosis not present

## 2020-06-16 DIAGNOSIS — R2242 Localized swelling, mass and lump, left lower limb: Secondary | ICD-10-CM | POA: Diagnosis not present

## 2020-06-16 DIAGNOSIS — D4989 Neoplasm of unspecified behavior of other specified sites: Secondary | ICD-10-CM | POA: Diagnosis not present

## 2020-06-16 DIAGNOSIS — Z483 Aftercare following surgery for neoplasm: Secondary | ICD-10-CM | POA: Diagnosis not present

## 2020-07-04 DIAGNOSIS — C4922 Malignant neoplasm of connective and soft tissue of left lower limb, including hip: Secondary | ICD-10-CM | POA: Diagnosis not present

## 2020-07-04 DIAGNOSIS — R918 Other nonspecific abnormal finding of lung field: Secondary | ICD-10-CM | POA: Diagnosis not present

## 2020-07-18 ENCOUNTER — Encounter: Payer: Self-pay | Admitting: Podiatry

## 2020-07-18 ENCOUNTER — Other Ambulatory Visit: Payer: Self-pay

## 2020-07-18 ENCOUNTER — Ambulatory Visit (INDEPENDENT_AMBULATORY_CARE_PROVIDER_SITE_OTHER): Payer: Medicare HMO | Admitting: Podiatry

## 2020-07-18 DIAGNOSIS — M79676 Pain in unspecified toe(s): Secondary | ICD-10-CM

## 2020-07-18 DIAGNOSIS — B351 Tinea unguium: Secondary | ICD-10-CM

## 2020-07-18 NOTE — Progress Notes (Signed)
Subjective: Bryan Vincent is a 75 y.o. male patient seen today painful mycotic nails b/l that are difficult to trim. Pain interferes with ambulation. Aggravating factors include wearing enclosed shoe gear. Pain is relieved with periodic professional debridement.   He states he had an episode of gout in left foot which has since resolved.  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   Vitamin B12 deficiency 09/12/2019   Claudication in peripheral vascular disease ([place]) 02/09/2018   Cramp in limb 02/09/2018   Preventative health care 02/09/2018   Primary hyperparathyroidism ([place]) 01/27/2017   Macrocytosis without anemia 01/27/2017   Tobacco abuse 01/26/2017   Essential hypertension 06/15/2016   Benign prostatic hyperplasia without lower urinary tract symptoms 06/15/2016    Current Outpatient Medications on File Prior to Visit  Medication Sig Dispense Refill   albuterol (VENTOLIN HFA) 108 (90 Base) MCG/ACT inhaler Inhale 2 puffs into the lungs every 6 (six) hours as needed for wheezing or shortness of breath. 8 g 0   amLODipine (NORVASC) 10 MG tablet Take 1 tablet (10 mg total) by mouth daily. 90 tablet 3   atorvastatin (LIPITOR) 20 MG tablet Take 1 tablet (20 mg total) by mouth daily. 90 tablet 3   carvedilol (COREG) 6.25 MG tablet Take 1 tablet (6.25 mg total) by mouth 2 (two) times daily with a meal. 180 tablet 3   Cholecalciferol (VITAMIN D) 50 MCG (2000 UT) tablet Take 1 tablet (2,000 Units total) by mouth daily. 90 tablet 3   cinacalcet (SENSIPAR) 30 MG tablet Take 1 tablet (30 mg total) by mouth 2 (two) times daily with a meal. 180 tablet 3   COMBIGAN 0.2-0.5 % ophthalmic solution      LUMIGAN 0.01 % SOLN      tamsulosin (FLOMAX) 0.4 MG CAPS capsule  Take 1 capsule (0.4 mg total) by mouth daily. 90 capsule 3   vitamin B-12 (CYANOCOBALAMIN) 1000 MCG tablet Take 1 tablet (1,000 mcg total) by mouth daily. 90 tablet 3   Vitamin D, Ergocalciferol, (DRISDOL) 1.25 MG (50000 UNIT) CAPS capsule Take 1 capsule (50,000 Units total) by mouth every 7 (seven) days. 13 capsule 3   No current facility-administered medications on file prior to visit.    No Known Allergies  Objective: Physical Exam  General: Bryan Vincent is a pleasant 75 y.o. African American male, in NAD. AAO x 3.   Vascular:  Neurovascular status unchanged b/l lower extremities. Capillary fill time to digits <3 seconds b/l lower extremities. Palpable DP pulses b/l. Palpable PT pulses b/l. Skin temperature gradient within normal limits b/l. No edema noted b/l.  Dermatological:  Pedal skin with normal turgor, texture and tone bilaterally. No open wounds bilaterally. No interdigital macerations bilaterally. Toenails 1-5 b/l elongated, discolored, dystrophic, thickened, crumbly with subungual debris and tenderness to dorsal palpation. No hyperkeratotic nor porokeratotic lesions present on today's visit.  Musculoskeletal:  Normal muscle strength 5/5 to all lower extremity muscle groups bilaterally. No pain crepitus or joint limitation noted with ROM b/l. No gross bony deformities bilaterally. Hallux valgus with bunion deformity noted b/l lower extremities. Hammertoes noted to the 2-5 bilaterally.  Neurological:  Protective sensation diminished with 10g monofilament b/l.  Assessment and Plan:  1. Pain due to onychomycosis of toenail     -  Examined patient. -No new findings. No new orders. -Toenails 1-5 b/l were debrided in length and girth with sterile nail nippers and dremel without iatrogenic bleeding.  -Patient to report any pedal injuries to medical professional immediately. -Patient to continue soft, supportive shoe gear daily. -Patient/POA to call should there be  question/concern in the interim.  Return in about 3 months (around 10/18/2020) for [place] .  Felipa Furnace, DPM

## 2020-07-22 DIAGNOSIS — C4922 Malignant neoplasm of connective and soft tissue of left lower limb, including hip: Secondary | ICD-10-CM | POA: Diagnosis not present

## 2020-07-23 ENCOUNTER — Encounter: Payer: Self-pay | Admitting: *Deleted

## 2020-07-29 DIAGNOSIS — Z4789 Encounter for other orthopedic aftercare: Secondary | ICD-10-CM | POA: Diagnosis not present

## 2020-07-29 DIAGNOSIS — R278 Other lack of coordination: Secondary | ICD-10-CM | POA: Diagnosis not present

## 2020-07-29 DIAGNOSIS — E878 Other disorders of electrolyte and fluid balance, not elsewhere classified: Secondary | ICD-10-CM | POA: Diagnosis not present

## 2020-07-29 DIAGNOSIS — N400 Enlarged prostate without lower urinary tract symptoms: Secondary | ICD-10-CM | POA: Diagnosis not present

## 2020-07-29 DIAGNOSIS — M7989 Other specified soft tissue disorders: Secondary | ICD-10-CM | POA: Diagnosis not present

## 2020-07-29 DIAGNOSIS — R2681 Unsteadiness on feet: Secondary | ICD-10-CM | POA: Diagnosis not present

## 2020-07-29 DIAGNOSIS — R062 Wheezing: Secondary | ICD-10-CM | POA: Diagnosis not present

## 2020-07-29 DIAGNOSIS — Z20822 Contact with and (suspected) exposure to covid-19: Secondary | ICD-10-CM | POA: Diagnosis not present

## 2020-07-29 DIAGNOSIS — D62 Acute posthemorrhagic anemia: Secondary | ICD-10-CM | POA: Diagnosis not present

## 2020-07-29 DIAGNOSIS — G4733 Obstructive sleep apnea (adult) (pediatric): Secondary | ICD-10-CM | POA: Diagnosis not present

## 2020-07-29 DIAGNOSIS — E785 Hyperlipidemia, unspecified: Secondary | ICD-10-CM | POA: Diagnosis not present

## 2020-07-29 DIAGNOSIS — D649 Anemia, unspecified: Secondary | ICD-10-CM | POA: Diagnosis not present

## 2020-07-29 DIAGNOSIS — I129 Hypertensive chronic kidney disease with stage 1 through stage 4 chronic kidney disease, or unspecified chronic kidney disease: Secondary | ICD-10-CM | POA: Diagnosis not present

## 2020-07-29 DIAGNOSIS — I739 Peripheral vascular disease, unspecified: Secondary | ICD-10-CM | POA: Diagnosis not present

## 2020-07-29 DIAGNOSIS — F17210 Nicotine dependence, cigarettes, uncomplicated: Secondary | ICD-10-CM | POA: Diagnosis not present

## 2020-07-29 DIAGNOSIS — R262 Difficulty in walking, not elsewhere classified: Secondary | ICD-10-CM | POA: Diagnosis not present

## 2020-07-29 DIAGNOSIS — Z720 Tobacco use: Secondary | ICD-10-CM | POA: Diagnosis not present

## 2020-07-29 DIAGNOSIS — Z741 Need for assistance with personal care: Secondary | ICD-10-CM | POA: Diagnosis not present

## 2020-07-29 DIAGNOSIS — C4922 Malignant neoplasm of connective and soft tissue of left lower limb, including hip: Secondary | ICD-10-CM | POA: Diagnosis not present

## 2020-07-29 DIAGNOSIS — G8918 Other acute postprocedural pain: Secondary | ICD-10-CM | POA: Diagnosis not present

## 2020-07-29 DIAGNOSIS — N189 Chronic kidney disease, unspecified: Secondary | ICD-10-CM | POA: Diagnosis not present

## 2020-07-29 DIAGNOSIS — M6281 Muscle weakness (generalized): Secondary | ICD-10-CM | POA: Diagnosis not present

## 2020-07-29 DIAGNOSIS — I10 Essential (primary) hypertension: Secondary | ICD-10-CM | POA: Diagnosis not present

## 2020-07-29 DIAGNOSIS — R739 Hyperglycemia, unspecified: Secondary | ICD-10-CM | POA: Diagnosis not present

## 2020-07-29 DIAGNOSIS — E213 Hyperparathyroidism, unspecified: Secondary | ICD-10-CM | POA: Diagnosis not present

## 2020-07-31 DIAGNOSIS — E785 Hyperlipidemia, unspecified: Secondary | ICD-10-CM | POA: Diagnosis not present

## 2020-07-31 DIAGNOSIS — E213 Hyperparathyroidism, unspecified: Secondary | ICD-10-CM | POA: Diagnosis not present

## 2020-07-31 DIAGNOSIS — C4922 Malignant neoplasm of connective and soft tissue of left lower limb, including hip: Secondary | ICD-10-CM | POA: Diagnosis not present

## 2020-07-31 DIAGNOSIS — F17210 Nicotine dependence, cigarettes, uncomplicated: Secondary | ICD-10-CM | POA: Diagnosis not present

## 2020-07-31 DIAGNOSIS — G4733 Obstructive sleep apnea (adult) (pediatric): Secondary | ICD-10-CM | POA: Diagnosis not present

## 2020-07-31 DIAGNOSIS — I10 Essential (primary) hypertension: Secondary | ICD-10-CM | POA: Diagnosis not present

## 2020-08-07 DIAGNOSIS — R278 Other lack of coordination: Secondary | ICD-10-CM | POA: Diagnosis not present

## 2020-08-07 DIAGNOSIS — C4922 Malignant neoplasm of connective and soft tissue of left lower limb, including hip: Secondary | ICD-10-CM | POA: Diagnosis not present

## 2020-08-07 DIAGNOSIS — E213 Hyperparathyroidism, unspecified: Secondary | ICD-10-CM | POA: Diagnosis not present

## 2020-08-07 DIAGNOSIS — M150 Primary generalized (osteo)arthritis: Secondary | ICD-10-CM | POA: Diagnosis not present

## 2020-08-07 DIAGNOSIS — D649 Anemia, unspecified: Secondary | ICD-10-CM | POA: Diagnosis not present

## 2020-08-07 DIAGNOSIS — H409 Unspecified glaucoma: Secondary | ICD-10-CM | POA: Diagnosis not present

## 2020-08-07 DIAGNOSIS — Z741 Need for assistance with personal care: Secondary | ICD-10-CM | POA: Diagnosis not present

## 2020-08-07 DIAGNOSIS — I10 Essential (primary) hypertension: Secondary | ICD-10-CM | POA: Diagnosis not present

## 2020-08-07 DIAGNOSIS — N400 Enlarged prostate without lower urinary tract symptoms: Secondary | ICD-10-CM | POA: Diagnosis not present

## 2020-08-07 DIAGNOSIS — I739 Peripheral vascular disease, unspecified: Secondary | ICD-10-CM | POA: Diagnosis not present

## 2020-08-07 DIAGNOSIS — C499 Malignant neoplasm of connective and soft tissue, unspecified: Secondary | ICD-10-CM | POA: Diagnosis not present

## 2020-08-07 DIAGNOSIS — Z89512 Acquired absence of left leg below knee: Secondary | ICD-10-CM | POA: Diagnosis not present

## 2020-08-07 DIAGNOSIS — M6281 Muscle weakness (generalized): Secondary | ICD-10-CM | POA: Diagnosis not present

## 2020-08-07 DIAGNOSIS — Z4789 Encounter for other orthopedic aftercare: Secondary | ICD-10-CM | POA: Diagnosis not present

## 2020-08-07 DIAGNOSIS — Z4781 Encounter for orthopedic aftercare following surgical amputation: Secondary | ICD-10-CM | POA: Diagnosis not present

## 2020-08-07 DIAGNOSIS — R262 Difficulty in walking, not elsewhere classified: Secondary | ICD-10-CM | POA: Diagnosis not present

## 2020-08-07 DIAGNOSIS — E785 Hyperlipidemia, unspecified: Secondary | ICD-10-CM | POA: Diagnosis not present

## 2020-08-07 DIAGNOSIS — S88112D Complete traumatic amputation at level between knee and ankle, left lower leg, subsequent encounter: Secondary | ICD-10-CM | POA: Diagnosis not present

## 2020-08-07 DIAGNOSIS — R2681 Unsteadiness on feet: Secondary | ICD-10-CM | POA: Diagnosis not present

## 2020-08-08 DIAGNOSIS — N400 Enlarged prostate without lower urinary tract symptoms: Secondary | ICD-10-CM | POA: Diagnosis not present

## 2020-08-08 DIAGNOSIS — H409 Unspecified glaucoma: Secondary | ICD-10-CM | POA: Diagnosis not present

## 2020-08-08 DIAGNOSIS — C499 Malignant neoplasm of connective and soft tissue, unspecified: Secondary | ICD-10-CM | POA: Diagnosis not present

## 2020-08-08 DIAGNOSIS — I10 Essential (primary) hypertension: Secondary | ICD-10-CM | POA: Diagnosis not present

## 2020-08-08 DIAGNOSIS — E213 Hyperparathyroidism, unspecified: Secondary | ICD-10-CM | POA: Diagnosis not present

## 2020-08-08 DIAGNOSIS — Z89512 Acquired absence of left leg below knee: Secondary | ICD-10-CM | POA: Diagnosis not present

## 2020-08-08 DIAGNOSIS — M150 Primary generalized (osteo)arthritis: Secondary | ICD-10-CM | POA: Diagnosis not present

## 2020-08-08 DIAGNOSIS — D649 Anemia, unspecified: Secondary | ICD-10-CM | POA: Diagnosis not present

## 2020-08-08 DIAGNOSIS — I739 Peripheral vascular disease, unspecified: Secondary | ICD-10-CM | POA: Diagnosis not present

## 2020-08-08 DIAGNOSIS — E785 Hyperlipidemia, unspecified: Secondary | ICD-10-CM | POA: Diagnosis not present

## 2020-08-13 DIAGNOSIS — D649 Anemia, unspecified: Secondary | ICD-10-CM | POA: Diagnosis not present

## 2020-08-13 DIAGNOSIS — E785 Hyperlipidemia, unspecified: Secondary | ICD-10-CM | POA: Diagnosis not present

## 2020-08-13 DIAGNOSIS — M150 Primary generalized (osteo)arthritis: Secondary | ICD-10-CM | POA: Diagnosis not present

## 2020-08-13 DIAGNOSIS — I739 Peripheral vascular disease, unspecified: Secondary | ICD-10-CM | POA: Diagnosis not present

## 2020-08-13 DIAGNOSIS — S88112D Complete traumatic amputation at level between knee and ankle, left lower leg, subsequent encounter: Secondary | ICD-10-CM | POA: Diagnosis not present

## 2020-08-19 DIAGNOSIS — Z4781 Encounter for orthopedic aftercare following surgical amputation: Secondary | ICD-10-CM | POA: Diagnosis not present

## 2020-08-19 DIAGNOSIS — Z89512 Acquired absence of left leg below knee: Secondary | ICD-10-CM | POA: Diagnosis not present

## 2020-08-21 DIAGNOSIS — S88112D Complete traumatic amputation at level between knee and ankle, left lower leg, subsequent encounter: Secondary | ICD-10-CM | POA: Diagnosis not present

## 2020-08-21 DIAGNOSIS — I10 Essential (primary) hypertension: Secondary | ICD-10-CM | POA: Diagnosis not present

## 2020-08-21 DIAGNOSIS — I739 Peripheral vascular disease, unspecified: Secondary | ICD-10-CM | POA: Diagnosis not present

## 2020-08-28 DIAGNOSIS — M150 Primary generalized (osteo)arthritis: Secondary | ICD-10-CM | POA: Diagnosis not present

## 2020-08-28 DIAGNOSIS — S88112D Complete traumatic amputation at level between knee and ankle, left lower leg, subsequent encounter: Secondary | ICD-10-CM | POA: Diagnosis not present

## 2020-08-28 DIAGNOSIS — I739 Peripheral vascular disease, unspecified: Secondary | ICD-10-CM | POA: Diagnosis not present

## 2020-08-28 DIAGNOSIS — I10 Essential (primary) hypertension: Secondary | ICD-10-CM | POA: Diagnosis not present

## 2020-09-02 DIAGNOSIS — I739 Peripheral vascular disease, unspecified: Secondary | ICD-10-CM | POA: Diagnosis not present

## 2020-09-02 DIAGNOSIS — M150 Primary generalized (osteo)arthritis: Secondary | ICD-10-CM | POA: Diagnosis not present

## 2020-09-02 DIAGNOSIS — S88112D Complete traumatic amputation at level between knee and ankle, left lower leg, subsequent encounter: Secondary | ICD-10-CM | POA: Diagnosis not present

## 2020-09-09 DIAGNOSIS — M150 Primary generalized (osteo)arthritis: Secondary | ICD-10-CM | POA: Diagnosis not present

## 2020-09-09 DIAGNOSIS — S88112D Complete traumatic amputation at level between knee and ankle, left lower leg, subsequent encounter: Secondary | ICD-10-CM | POA: Diagnosis not present

## 2020-09-09 DIAGNOSIS — I739 Peripheral vascular disease, unspecified: Secondary | ICD-10-CM | POA: Diagnosis not present

## 2020-09-09 DIAGNOSIS — N400 Enlarged prostate without lower urinary tract symptoms: Secondary | ICD-10-CM | POA: Diagnosis not present

## 2020-09-12 DIAGNOSIS — G4730 Sleep apnea, unspecified: Secondary | ICD-10-CM | POA: Diagnosis not present

## 2020-09-12 DIAGNOSIS — Z483 Aftercare following surgery for neoplasm: Secondary | ICD-10-CM | POA: Diagnosis not present

## 2020-09-12 DIAGNOSIS — C4922 Malignant neoplasm of connective and soft tissue of left lower limb, including hip: Secondary | ICD-10-CM | POA: Diagnosis not present

## 2020-09-12 DIAGNOSIS — N400 Enlarged prostate without lower urinary tract symptoms: Secondary | ICD-10-CM | POA: Diagnosis not present

## 2020-09-12 DIAGNOSIS — E785 Hyperlipidemia, unspecified: Secondary | ICD-10-CM | POA: Diagnosis not present

## 2020-09-12 DIAGNOSIS — D631 Anemia in chronic kidney disease: Secondary | ICD-10-CM | POA: Diagnosis not present

## 2020-09-12 DIAGNOSIS — I739 Peripheral vascular disease, unspecified: Secondary | ICD-10-CM | POA: Diagnosis not present

## 2020-09-12 DIAGNOSIS — N183 Chronic kidney disease, stage 3 unspecified: Secondary | ICD-10-CM | POA: Diagnosis not present

## 2020-09-12 DIAGNOSIS — I129 Hypertensive chronic kidney disease with stage 1 through stage 4 chronic kidney disease, or unspecified chronic kidney disease: Secondary | ICD-10-CM | POA: Diagnosis not present

## 2020-09-15 ENCOUNTER — Encounter: Payer: Medicare HMO | Admitting: Internal Medicine

## 2020-09-15 DIAGNOSIS — E785 Hyperlipidemia, unspecified: Secondary | ICD-10-CM | POA: Diagnosis not present

## 2020-09-15 DIAGNOSIS — N183 Chronic kidney disease, stage 3 unspecified: Secondary | ICD-10-CM | POA: Diagnosis not present

## 2020-09-15 DIAGNOSIS — Z483 Aftercare following surgery for neoplasm: Secondary | ICD-10-CM | POA: Diagnosis not present

## 2020-09-15 DIAGNOSIS — D631 Anemia in chronic kidney disease: Secondary | ICD-10-CM | POA: Diagnosis not present

## 2020-09-15 DIAGNOSIS — N400 Enlarged prostate without lower urinary tract symptoms: Secondary | ICD-10-CM | POA: Diagnosis not present

## 2020-09-15 DIAGNOSIS — C4922 Malignant neoplasm of connective and soft tissue of left lower limb, including hip: Secondary | ICD-10-CM | POA: Diagnosis not present

## 2020-09-15 DIAGNOSIS — I739 Peripheral vascular disease, unspecified: Secondary | ICD-10-CM | POA: Diagnosis not present

## 2020-09-15 DIAGNOSIS — G4730 Sleep apnea, unspecified: Secondary | ICD-10-CM | POA: Diagnosis not present

## 2020-09-15 DIAGNOSIS — I129 Hypertensive chronic kidney disease with stage 1 through stage 4 chronic kidney disease, or unspecified chronic kidney disease: Secondary | ICD-10-CM | POA: Diagnosis not present

## 2020-09-16 ENCOUNTER — Telehealth: Payer: Self-pay | Admitting: Internal Medicine

## 2020-09-16 NOTE — Telephone Encounter (Signed)
Appt made 09/18/2020.

## 2020-09-17 DIAGNOSIS — N400 Enlarged prostate without lower urinary tract symptoms: Secondary | ICD-10-CM | POA: Diagnosis not present

## 2020-09-17 DIAGNOSIS — N183 Chronic kidney disease, stage 3 unspecified: Secondary | ICD-10-CM | POA: Diagnosis not present

## 2020-09-17 DIAGNOSIS — G4730 Sleep apnea, unspecified: Secondary | ICD-10-CM | POA: Diagnosis not present

## 2020-09-17 DIAGNOSIS — Z483 Aftercare following surgery for neoplasm: Secondary | ICD-10-CM | POA: Diagnosis not present

## 2020-09-17 DIAGNOSIS — I739 Peripheral vascular disease, unspecified: Secondary | ICD-10-CM | POA: Diagnosis not present

## 2020-09-17 DIAGNOSIS — C4922 Malignant neoplasm of connective and soft tissue of left lower limb, including hip: Secondary | ICD-10-CM | POA: Diagnosis not present

## 2020-09-17 DIAGNOSIS — D631 Anemia in chronic kidney disease: Secondary | ICD-10-CM | POA: Diagnosis not present

## 2020-09-17 DIAGNOSIS — E785 Hyperlipidemia, unspecified: Secondary | ICD-10-CM | POA: Diagnosis not present

## 2020-09-17 DIAGNOSIS — I129 Hypertensive chronic kidney disease with stage 1 through stage 4 chronic kidney disease, or unspecified chronic kidney disease: Secondary | ICD-10-CM | POA: Diagnosis not present

## 2020-09-18 ENCOUNTER — Ambulatory Visit (HOSPITAL_COMMUNITY)
Admission: RE | Admit: 2020-09-18 | Discharge: 2020-09-18 | Disposition: A | Discharge status: Home / Self Care | Payer: Medicare HMO | Source: Ambulatory Visit | Attending: Internal Medicine | Admitting: Internal Medicine

## 2020-09-18 ENCOUNTER — Other Ambulatory Visit: Payer: Self-pay

## 2020-09-18 ENCOUNTER — Ambulatory Visit (INDEPENDENT_AMBULATORY_CARE_PROVIDER_SITE_OTHER): Payer: Medicare HMO | Admitting: Internal Medicine

## 2020-09-18 VITALS — BP 105/62 | HR 95 | Temp 98.6°F | Ht 71.0 in

## 2020-09-18 DIAGNOSIS — M2540 Effusion, unspecified joint: Secondary | ICD-10-CM | POA: Insufficient documentation

## 2020-09-18 DIAGNOSIS — M7989 Other specified soft tissue disorders: Secondary | ICD-10-CM | POA: Diagnosis not present

## 2020-09-18 DIAGNOSIS — M79642 Pain in left hand: Secondary | ICD-10-CM | POA: Diagnosis not present

## 2020-09-18 DIAGNOSIS — S2242XA Multiple fractures of ribs, left side, initial encounter for closed fracture: Secondary | ICD-10-CM | POA: Diagnosis not present

## 2020-09-18 DIAGNOSIS — C4922 Malignant neoplasm of connective and soft tissue of left lower limb, including hip: Secondary | ICD-10-CM | POA: Diagnosis not present

## 2020-09-18 DIAGNOSIS — Z89512 Acquired absence of left leg below knee: Secondary | ICD-10-CM | POA: Diagnosis not present

## 2020-09-18 DIAGNOSIS — M19012 Primary osteoarthritis, left shoulder: Secondary | ICD-10-CM | POA: Diagnosis not present

## 2020-09-18 DIAGNOSIS — R600 Localized edema: Secondary | ICD-10-CM | POA: Diagnosis not present

## 2020-09-18 NOTE — Progress Notes (Signed)
   CC: Left arm pain and swelling  HPI:  [name] is a 75 y.o. with a past medical history listed below presenting for evaluation of left arm pain and swelling. For details of today's visit and the status of his chronic medical issues please refer to the assessment and plan.   Past Medical History:  Diagnosis Date   Arthritis    Benign prostatic hyperplasia without lower urinary tract symptoms 06/15/2016   BPH (benign prostatic hyperplasia)    Cramp in limb 02/09/2018   Edema of right lower extremity 06/15/2016   Edema of right lower extremity 06/15/2016   Glaucoma    Hypertension    Hypokalemia 11/03/2017   Macrocytosis without anemia 01/27/2017   Pain of right lower extremity 02/09/2018   Primary hyperparathyroidism ([place]) 01/27/2017   Diagnosed March 2019. - PTH 108, Ca 11 (<1 over ULN), eGFR 85 (>60)  - DEXA: T: -1.1 (ostepenia not indication for surgery) - No bone pain, renal stones, constipation, or nausea - 24-hr urinary calcium not done - Supplementing Vit D    Tachycardia 01/19/2017   Tobacco abuse 01/26/2017   Vision blurred 04/14/2017   Review of Systems:   Negative except as per assessment and plan  Physical Exam:  Vitals:   09/18/20 1157  BP: 105/62  Pulse: 95  Temp: 98.6 F (37 C)  TempSrc: Oral  SpO2: 100%  Height: $Remove'5\' 11"'FXimHTC$  (1.803 m)   Physical Exam General: alert, appears stated age, in no acute distress HEENT: Normocephalic, atraumatic, EOM intact, conjunctiva normal CV: Regular rate and rhythm, no murmurs rubs or gallops Pulm: Clear to auscultation bilaterally, normal work of breathing Abdomen: Soft, nondistended, bowel sounds present, no tenderness to palpation MSK: Limited range of motion of left shoulder and wrist with notable swelling of the left wrist and hand, first second and third MCP joints notably swollen, soft tender approximately centimeter mass along the distal radius Skin: No rashes, left hand notably warmer to touch compared to  right Neuro: Alert and oriented x3      Assessment & Plan:   See Encounters Tab for problem based charting.  Patient seen with Dr. Philipp Ovens

## 2020-09-18 NOTE — Assessment & Plan Note (Deleted)
Patient presents today for evaluation of left arm pain and swelling.  States on Tuesday night he noticed significant left shoulder and arm pain with significant left hand swelling.  He denies any injuries or trauma.  States he was sleeping on his left shoulder and initially thought that was what triggered his pain.  He is having trouble moving his arm and hand.  He has not tried any medications besides Tylenol for pain with minimal relief.  Denies any fever, nausea, vomiting, abdominal pain or chills.  Denies any other joint pain.  Denies any rashes. 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    Patient unfortunately does not recall previous joint swelling work-ups and does not recall if this is happened before.  Per chart review it seems that he has had numerous episodes of joint pain from his right big to bilateral ankles and knees which were previously worked up for gout.  Uric acid was checked about a year ago and was 8.3.  He has previously responded well to steroid treatments for his joint swelling.  Fluid analysis from previous knee joint aspiration did not yield any crystals.  On exam, both active and passive range of motion is severely limited due to pain.  There is significant swelling of the left hand and an approximately centimeter soft tissue mass noted at the ventral surface of the distal radius.    Point-of-care ultrasound was completed during the visit which showed a complex mass at the distal radius no color flow.  Ultrasound of the venous structures was also performed and more proximally we did not appreciate vein compressibility.   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  Concerned that there may also be soft tissue involvement near the left  shoulder which could be contributing to his shoulder pain versus a rotator cuff injury.  Less likely on the differential are gout and septic arthritis given his age and polyarthritic pain and no  systemic signs of infection.  Although of note patient does have numerous of joint swelling involving his great toe, bilateral ankles and knees with improvement on steroids.  - Stat vascular ultrasound of the left upper extremity to rule out DVT - Follow-up left hand and left shoulder radiographs -Pending on these results we may need to proceed with an MRI to further evaluate the soft tissue mass appreciated on the point-of-care ultrasound

## 2020-09-18 NOTE — Assessment & Plan Note (Addendum)
Patient presents today for evaluation of left arm pain and swelling.  States on Tuesday night he noticed significant left shoulder and arm pain with significant left hand swelling.  He denies any injuries or trauma.  States he was sleeping on his left shoulder and initially thought that was what triggered his pain.  He is having trouble moving his arm and hand.  He has not tried any medications besides Tylenol for pain with minimal relief.  Denies any fever, nausea, vomiting, abdominal pain or chills.  Denies any other joint pain.  Denies any rashes. 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   Patient unfortunately does not recall previous joint swelling work-ups and does not recall if this is happened before.  Per chart review it seems that he has had numerous episodes of joint pain from his right big to bilateral ankles and knees which were previously worked up for gout.  Uric acid was checked about a year ago and was 8.3.  He has previously responded well to steroid treatments for his joint swelling.  Fluid analysis from previous knee joint aspiration did not yield any crystals.  On exam, both active and passive range of motion is severely limited due to pain.  There is significant swelling of the left hand and an approximately centimeter soft tissue mass noted at the ventral surface of the distal radius.    Point-of-care ultrasound was completed during the visit which showed a complex mass at the distal radius no color flow.  Ultrasound of the venous structures was also performed and more proximally we did not appreciate vein compressibility.       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  Concerned that there may also be soft tissue involvement near the  left shoulder which could be contributing to his shoulder pain versus a rotator cuff injury.  Of note, per chart review patient has a history of double episodes of joint swelling involving his great toe, foot, bilateral ankles and bilateral knees last year.  Suspected gout at that time but this is less likely on the differential.     - Stat vascular ultrasound of the left upper extremity did not yield a DVT, did show a chronic clot in the superficial cephalic vein - Follow-up left hand and left shoulder radiographs -Pending on these results we may need to proceed with a CT or MRI to further evaluate the soft tissue mass appreciated on the point-of-care ultrasound    ADDENDUM: Spoke with Dottie from Dr. Gara Kroner orthopedic surgery's office who informed me that the patient had a follow-up appointment this past Tuesday, he reported a fall at home with subsequent left hand pain and swelling.  He missed his follow-up appointment she suspects due to transportation issues. Discussed his clinical presentation and the mass appreciated on POCUS.  Dr. Jeani Hawking recommended if his pain continues to improve no further work-up and they will follow-up the week of 8/22.  If symptoms persist or worsen recommended obtaining an MRI of the left wrist.  She stated that recurrent sarcoma would be unlikely in the upper extremity given his initial malignancy was in his left lower extremity.  Appreciate their prompt assistance.  Spoke with patient this morning who states his pain and swelling have slightly improved.  Gave him return precautions and are on call clinic number.  Will follow-up with him next week to assess his symptoms.

## 2020-09-18 NOTE — Patient Instructions (Signed)
I have ordered some imaging, I will call you soon as I get the results of this.  In the meantime continue taking Percocet as needed for your pain.

## 2020-09-18 NOTE — Assessment & Plan Note (Addendum)
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  Patient states he just finished the rehab and continued physical therapy at home.  Patient's fianc is his primary caregiver.  He is requesting personal care services and a wheelchair.

## 2020-09-18 NOTE — Progress Notes (Signed)
Left upper extremity venous duplex completed. Refer to "CV Proc" under chart review to view preliminary results.  09/18/2020 3:55 PM Kelby Aline., MHA, RVT, RDCS, RDMS

## 2020-09-18 NOTE — Assessment & Plan Note (Addendum)
Patient presents with left shoulder pain and left wrist/hand pain and swelling since Tuesday night.  Refer to assessment and plan under soft tissue mass.

## 2020-09-23 ENCOUNTER — Other Ambulatory Visit: Payer: Self-pay | Admitting: Internal Medicine

## 2020-09-23 DIAGNOSIS — M7989 Other specified soft tissue disorders: Secondary | ICD-10-CM

## 2020-09-23 NOTE — Progress Notes (Signed)
Internal Medicine Clinic Attending  I saw and evaluated the patient.  I personally confirmed the key portions of the history and exam documented by Dr. Laural Golden and I reviewed pertinent patient test results.  The assessment, diagnosis, and plan were formulated together and I agree with the documentation in the resident's note.   Patient presenting with upper extremity pain and swelling involving his left hand and shoulder. He has a history of right lower extremity sarcoma with resection earlier in the year with orthopedic oncology and plastic surgery at [facility]. 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 On exam he has almost no active or passive ROM of his shoulder and wrist with diffuse soft tissue edema. On evaluation of his wrist, he has a soft tissue mass like structure at the base of his left thumb. POC ultrasound images obtained and uploaded to the chart (see media tab). 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 Due to the concern for soft tissue mass, Dr. Laural Golden reached out to his orthopedic oncologist. Apparently patient had a recent fall on his left side which he called their office about but repeatedly denied to [place]. It's possible this is simply a wrist sprain from his fall, however given ultrasound findings and history of sarcoma will proceed with MRI to further evaluate this questionable mass. He has follow up scheduled with his orthopedic surgery later this month. 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   I am also concerned about possible memory loss with his inconsistent history. He will eventually need to be worked up  for dementia. Lastly, recommend DEXA scan at his next appointment. According to the xray reports, his bones are subjectively under mineralized.

## 2020-09-23 NOTE — Addendum Note (Signed)
Addended by: Mike Craze on: 09/23/2020 01:49 PM   Modules accepted: Orders

## 2020-09-24 DIAGNOSIS — C4922 Malignant neoplasm of connective and soft tissue of left lower limb, including hip: Secondary | ICD-10-CM | POA: Diagnosis not present

## 2020-09-24 DIAGNOSIS — Z483 Aftercare following surgery for neoplasm: Secondary | ICD-10-CM | POA: Diagnosis not present

## 2020-09-24 DIAGNOSIS — N183 Chronic kidney disease, stage 3 unspecified: Secondary | ICD-10-CM | POA: Diagnosis not present

## 2020-09-24 DIAGNOSIS — E785 Hyperlipidemia, unspecified: Secondary | ICD-10-CM | POA: Diagnosis not present

## 2020-09-24 DIAGNOSIS — I739 Peripheral vascular disease, unspecified: Secondary | ICD-10-CM | POA: Diagnosis not present

## 2020-09-24 DIAGNOSIS — G4730 Sleep apnea, unspecified: Secondary | ICD-10-CM | POA: Diagnosis not present

## 2020-09-24 DIAGNOSIS — D631 Anemia in chronic kidney disease: Secondary | ICD-10-CM | POA: Diagnosis not present

## 2020-09-24 DIAGNOSIS — I129 Hypertensive chronic kidney disease with stage 1 through stage 4 chronic kidney disease, or unspecified chronic kidney disease: Secondary | ICD-10-CM | POA: Diagnosis not present

## 2020-09-24 DIAGNOSIS — N400 Enlarged prostate without lower urinary tract symptoms: Secondary | ICD-10-CM | POA: Diagnosis not present

## 2020-09-26 DIAGNOSIS — M6281 Muscle weakness (generalized): Secondary | ICD-10-CM | POA: Diagnosis not present

## 2020-09-26 DIAGNOSIS — Z4789 Encounter for other orthopedic aftercare: Secondary | ICD-10-CM | POA: Diagnosis not present

## 2020-09-26 DIAGNOSIS — R262 Difficulty in walking, not elsewhere classified: Secondary | ICD-10-CM | POA: Diagnosis not present

## 2020-09-26 DIAGNOSIS — C4922 Malignant neoplasm of connective and soft tissue of left lower limb, including hip: Secondary | ICD-10-CM | POA: Diagnosis not present

## 2020-09-29 DIAGNOSIS — G4730 Sleep apnea, unspecified: Secondary | ICD-10-CM | POA: Diagnosis not present

## 2020-09-29 DIAGNOSIS — N400 Enlarged prostate without lower urinary tract symptoms: Secondary | ICD-10-CM | POA: Diagnosis not present

## 2020-09-29 DIAGNOSIS — D631 Anemia in chronic kidney disease: Secondary | ICD-10-CM | POA: Diagnosis not present

## 2020-09-29 DIAGNOSIS — C4922 Malignant neoplasm of connective and soft tissue of left lower limb, including hip: Secondary | ICD-10-CM | POA: Diagnosis not present

## 2020-09-29 DIAGNOSIS — I129 Hypertensive chronic kidney disease with stage 1 through stage 4 chronic kidney disease, or unspecified chronic kidney disease: Secondary | ICD-10-CM | POA: Diagnosis not present

## 2020-09-29 DIAGNOSIS — N183 Chronic kidney disease, stage 3 unspecified: Secondary | ICD-10-CM | POA: Diagnosis not present

## 2020-09-29 DIAGNOSIS — E785 Hyperlipidemia, unspecified: Secondary | ICD-10-CM | POA: Diagnosis not present

## 2020-09-29 DIAGNOSIS — Z483 Aftercare following surgery for neoplasm: Secondary | ICD-10-CM | POA: Diagnosis not present

## 2020-09-29 DIAGNOSIS — I739 Peripheral vascular disease, unspecified: Secondary | ICD-10-CM | POA: Diagnosis not present

## 2020-09-30 DIAGNOSIS — Z89512 Acquired absence of left leg below knee: Secondary | ICD-10-CM | POA: Diagnosis not present

## 2020-09-30 DIAGNOSIS — C4922 Malignant neoplasm of connective and soft tissue of left lower limb, including hip: Secondary | ICD-10-CM | POA: Diagnosis not present

## 2020-10-01 DIAGNOSIS — D631 Anemia in chronic kidney disease: Secondary | ICD-10-CM | POA: Diagnosis not present

## 2020-10-01 DIAGNOSIS — N183 Chronic kidney disease, stage 3 unspecified: Secondary | ICD-10-CM | POA: Diagnosis not present

## 2020-10-01 DIAGNOSIS — G4730 Sleep apnea, unspecified: Secondary | ICD-10-CM | POA: Diagnosis not present

## 2020-10-01 DIAGNOSIS — Z483 Aftercare following surgery for neoplasm: Secondary | ICD-10-CM | POA: Diagnosis not present

## 2020-10-01 DIAGNOSIS — C4922 Malignant neoplasm of connective and soft tissue of left lower limb, including hip: Secondary | ICD-10-CM | POA: Diagnosis not present

## 2020-10-01 DIAGNOSIS — N400 Enlarged prostate without lower urinary tract symptoms: Secondary | ICD-10-CM | POA: Diagnosis not present

## 2020-10-01 DIAGNOSIS — E785 Hyperlipidemia, unspecified: Secondary | ICD-10-CM | POA: Diagnosis not present

## 2020-10-01 DIAGNOSIS — I129 Hypertensive chronic kidney disease with stage 1 through stage 4 chronic kidney disease, or unspecified chronic kidney disease: Secondary | ICD-10-CM | POA: Diagnosis not present

## 2020-10-01 DIAGNOSIS — I739 Peripheral vascular disease, unspecified: Secondary | ICD-10-CM | POA: Diagnosis not present

## 2020-10-02 DIAGNOSIS — I739 Peripheral vascular disease, unspecified: Secondary | ICD-10-CM | POA: Diagnosis not present

## 2020-10-02 DIAGNOSIS — Z483 Aftercare following surgery for neoplasm: Secondary | ICD-10-CM | POA: Diagnosis not present

## 2020-10-02 DIAGNOSIS — N400 Enlarged prostate without lower urinary tract symptoms: Secondary | ICD-10-CM | POA: Diagnosis not present

## 2020-10-02 DIAGNOSIS — D631 Anemia in chronic kidney disease: Secondary | ICD-10-CM | POA: Diagnosis not present

## 2020-10-02 DIAGNOSIS — I129 Hypertensive chronic kidney disease with stage 1 through stage 4 chronic kidney disease, or unspecified chronic kidney disease: Secondary | ICD-10-CM | POA: Diagnosis not present

## 2020-10-02 DIAGNOSIS — G4730 Sleep apnea, unspecified: Secondary | ICD-10-CM | POA: Diagnosis not present

## 2020-10-02 DIAGNOSIS — E785 Hyperlipidemia, unspecified: Secondary | ICD-10-CM | POA: Diagnosis not present

## 2020-10-02 DIAGNOSIS — C4922 Malignant neoplasm of connective and soft tissue of left lower limb, including hip: Secondary | ICD-10-CM | POA: Diagnosis not present

## 2020-10-02 DIAGNOSIS — N183 Chronic kidney disease, stage 3 unspecified: Secondary | ICD-10-CM | POA: Diagnosis not present

## 2020-10-06 DIAGNOSIS — I129 Hypertensive chronic kidney disease with stage 1 through stage 4 chronic kidney disease, or unspecified chronic kidney disease: Secondary | ICD-10-CM | POA: Diagnosis not present

## 2020-10-06 DIAGNOSIS — I739 Peripheral vascular disease, unspecified: Secondary | ICD-10-CM | POA: Diagnosis not present

## 2020-10-06 DIAGNOSIS — Z483 Aftercare following surgery for neoplasm: Secondary | ICD-10-CM | POA: Diagnosis not present

## 2020-10-06 DIAGNOSIS — E785 Hyperlipidemia, unspecified: Secondary | ICD-10-CM | POA: Diagnosis not present

## 2020-10-06 DIAGNOSIS — D631 Anemia in chronic kidney disease: Secondary | ICD-10-CM | POA: Diagnosis not present

## 2020-10-06 DIAGNOSIS — N183 Chronic kidney disease, stage 3 unspecified: Secondary | ICD-10-CM | POA: Diagnosis not present

## 2020-10-06 DIAGNOSIS — G4730 Sleep apnea, unspecified: Secondary | ICD-10-CM | POA: Diagnosis not present

## 2020-10-06 DIAGNOSIS — C4922 Malignant neoplasm of connective and soft tissue of left lower limb, including hip: Secondary | ICD-10-CM | POA: Diagnosis not present

## 2020-10-06 DIAGNOSIS — N400 Enlarged prostate without lower urinary tract symptoms: Secondary | ICD-10-CM | POA: Diagnosis not present

## 2020-10-08 DIAGNOSIS — I739 Peripheral vascular disease, unspecified: Secondary | ICD-10-CM | POA: Diagnosis not present

## 2020-10-08 DIAGNOSIS — N400 Enlarged prostate without lower urinary tract symptoms: Secondary | ICD-10-CM | POA: Diagnosis not present

## 2020-10-08 DIAGNOSIS — E785 Hyperlipidemia, unspecified: Secondary | ICD-10-CM | POA: Diagnosis not present

## 2020-10-08 DIAGNOSIS — D631 Anemia in chronic kidney disease: Secondary | ICD-10-CM | POA: Diagnosis not present

## 2020-10-08 DIAGNOSIS — N183 Chronic kidney disease, stage 3 unspecified: Secondary | ICD-10-CM | POA: Diagnosis not present

## 2020-10-08 DIAGNOSIS — Z483 Aftercare following surgery for neoplasm: Secondary | ICD-10-CM | POA: Diagnosis not present

## 2020-10-08 DIAGNOSIS — C4922 Malignant neoplasm of connective and soft tissue of left lower limb, including hip: Secondary | ICD-10-CM | POA: Diagnosis not present

## 2020-10-08 DIAGNOSIS — I129 Hypertensive chronic kidney disease with stage 1 through stage 4 chronic kidney disease, or unspecified chronic kidney disease: Secondary | ICD-10-CM | POA: Diagnosis not present

## 2020-10-08 DIAGNOSIS — G4730 Sleep apnea, unspecified: Secondary | ICD-10-CM | POA: Diagnosis not present

## 2020-10-12 DIAGNOSIS — N400 Enlarged prostate without lower urinary tract symptoms: Secondary | ICD-10-CM | POA: Diagnosis not present

## 2020-10-12 DIAGNOSIS — C4922 Malignant neoplasm of connective and soft tissue of left lower limb, including hip: Secondary | ICD-10-CM | POA: Diagnosis not present

## 2020-10-12 DIAGNOSIS — Z483 Aftercare following surgery for neoplasm: Secondary | ICD-10-CM | POA: Diagnosis not present

## 2020-10-12 DIAGNOSIS — I129 Hypertensive chronic kidney disease with stage 1 through stage 4 chronic kidney disease, or unspecified chronic kidney disease: Secondary | ICD-10-CM | POA: Diagnosis not present

## 2020-10-12 DIAGNOSIS — E785 Hyperlipidemia, unspecified: Secondary | ICD-10-CM | POA: Diagnosis not present

## 2020-10-12 DIAGNOSIS — D631 Anemia in chronic kidney disease: Secondary | ICD-10-CM | POA: Diagnosis not present

## 2020-10-12 DIAGNOSIS — I739 Peripheral vascular disease, unspecified: Secondary | ICD-10-CM | POA: Diagnosis not present

## 2020-10-12 DIAGNOSIS — N183 Chronic kidney disease, stage 3 unspecified: Secondary | ICD-10-CM | POA: Diagnosis not present

## 2020-10-12 DIAGNOSIS — G4730 Sleep apnea, unspecified: Secondary | ICD-10-CM | POA: Diagnosis not present

## 2020-10-14 DIAGNOSIS — C4922 Malignant neoplasm of connective and soft tissue of left lower limb, including hip: Secondary | ICD-10-CM | POA: Diagnosis not present

## 2020-10-14 DIAGNOSIS — D631 Anemia in chronic kidney disease: Secondary | ICD-10-CM | POA: Diagnosis not present

## 2020-10-14 DIAGNOSIS — I129 Hypertensive chronic kidney disease with stage 1 through stage 4 chronic kidney disease, or unspecified chronic kidney disease: Secondary | ICD-10-CM | POA: Diagnosis not present

## 2020-10-14 DIAGNOSIS — Z483 Aftercare following surgery for neoplasm: Secondary | ICD-10-CM | POA: Diagnosis not present

## 2020-10-14 DIAGNOSIS — E785 Hyperlipidemia, unspecified: Secondary | ICD-10-CM | POA: Diagnosis not present

## 2020-10-14 DIAGNOSIS — N400 Enlarged prostate without lower urinary tract symptoms: Secondary | ICD-10-CM | POA: Diagnosis not present

## 2020-10-14 DIAGNOSIS — N183 Chronic kidney disease, stage 3 unspecified: Secondary | ICD-10-CM | POA: Diagnosis not present

## 2020-10-14 DIAGNOSIS — G4730 Sleep apnea, unspecified: Secondary | ICD-10-CM | POA: Diagnosis not present

## 2020-10-14 DIAGNOSIS — I739 Peripheral vascular disease, unspecified: Secondary | ICD-10-CM | POA: Diagnosis not present

## 2020-10-15 DIAGNOSIS — N183 Chronic kidney disease, stage 3 unspecified: Secondary | ICD-10-CM | POA: Diagnosis not present

## 2020-10-15 DIAGNOSIS — C4922 Malignant neoplasm of connective and soft tissue of left lower limb, including hip: Secondary | ICD-10-CM | POA: Diagnosis not present

## 2020-10-15 DIAGNOSIS — Z483 Aftercare following surgery for neoplasm: Secondary | ICD-10-CM | POA: Diagnosis not present

## 2020-10-15 DIAGNOSIS — D631 Anemia in chronic kidney disease: Secondary | ICD-10-CM | POA: Diagnosis not present

## 2020-10-15 DIAGNOSIS — N400 Enlarged prostate without lower urinary tract symptoms: Secondary | ICD-10-CM | POA: Diagnosis not present

## 2020-10-15 DIAGNOSIS — I739 Peripheral vascular disease, unspecified: Secondary | ICD-10-CM | POA: Diagnosis not present

## 2020-10-15 DIAGNOSIS — G4730 Sleep apnea, unspecified: Secondary | ICD-10-CM | POA: Diagnosis not present

## 2020-10-15 DIAGNOSIS — E785 Hyperlipidemia, unspecified: Secondary | ICD-10-CM | POA: Diagnosis not present

## 2020-10-15 DIAGNOSIS — I129 Hypertensive chronic kidney disease with stage 1 through stage 4 chronic kidney disease, or unspecified chronic kidney disease: Secondary | ICD-10-CM | POA: Diagnosis not present

## 2020-10-20 ENCOUNTER — Ambulatory Visit (HOSPITAL_COMMUNITY): Admission: RE | Admit: 2020-10-20 | Payer: Medicare HMO | Source: Ambulatory Visit

## 2020-10-21 DIAGNOSIS — C4922 Malignant neoplasm of connective and soft tissue of left lower limb, including hip: Secondary | ICD-10-CM | POA: Diagnosis not present

## 2020-10-21 DIAGNOSIS — N400 Enlarged prostate without lower urinary tract symptoms: Secondary | ICD-10-CM | POA: Diagnosis not present

## 2020-10-21 DIAGNOSIS — I129 Hypertensive chronic kidney disease with stage 1 through stage 4 chronic kidney disease, or unspecified chronic kidney disease: Secondary | ICD-10-CM | POA: Diagnosis not present

## 2020-10-21 DIAGNOSIS — I739 Peripheral vascular disease, unspecified: Secondary | ICD-10-CM | POA: Diagnosis not present

## 2020-10-21 DIAGNOSIS — D631 Anemia in chronic kidney disease: Secondary | ICD-10-CM | POA: Diagnosis not present

## 2020-10-21 DIAGNOSIS — N183 Chronic kidney disease, stage 3 unspecified: Secondary | ICD-10-CM | POA: Diagnosis not present

## 2020-10-21 DIAGNOSIS — E785 Hyperlipidemia, unspecified: Secondary | ICD-10-CM | POA: Diagnosis not present

## 2020-10-21 DIAGNOSIS — G4730 Sleep apnea, unspecified: Secondary | ICD-10-CM | POA: Diagnosis not present

## 2020-10-21 DIAGNOSIS — Z483 Aftercare following surgery for neoplasm: Secondary | ICD-10-CM | POA: Diagnosis not present

## 2020-10-23 DIAGNOSIS — I129 Hypertensive chronic kidney disease with stage 1 through stage 4 chronic kidney disease, or unspecified chronic kidney disease: Secondary | ICD-10-CM | POA: Diagnosis not present

## 2020-10-23 DIAGNOSIS — Z483 Aftercare following surgery for neoplasm: Secondary | ICD-10-CM | POA: Diagnosis not present

## 2020-10-23 DIAGNOSIS — E785 Hyperlipidemia, unspecified: Secondary | ICD-10-CM | POA: Diagnosis not present

## 2020-10-23 DIAGNOSIS — G4730 Sleep apnea, unspecified: Secondary | ICD-10-CM | POA: Diagnosis not present

## 2020-10-23 DIAGNOSIS — N183 Chronic kidney disease, stage 3 unspecified: Secondary | ICD-10-CM | POA: Diagnosis not present

## 2020-10-23 DIAGNOSIS — I739 Peripheral vascular disease, unspecified: Secondary | ICD-10-CM | POA: Diagnosis not present

## 2020-10-23 DIAGNOSIS — N400 Enlarged prostate without lower urinary tract symptoms: Secondary | ICD-10-CM | POA: Diagnosis not present

## 2020-10-23 DIAGNOSIS — D631 Anemia in chronic kidney disease: Secondary | ICD-10-CM | POA: Diagnosis not present

## 2020-10-23 DIAGNOSIS — C4922 Malignant neoplasm of connective and soft tissue of left lower limb, including hip: Secondary | ICD-10-CM | POA: Diagnosis not present

## 2020-10-24 ENCOUNTER — Ambulatory Visit: Payer: Medicare HMO | Admitting: Podiatry

## 2020-10-29 DIAGNOSIS — I739 Peripheral vascular disease, unspecified: Secondary | ICD-10-CM | POA: Diagnosis not present

## 2020-10-29 DIAGNOSIS — G4730 Sleep apnea, unspecified: Secondary | ICD-10-CM | POA: Diagnosis not present

## 2020-10-29 DIAGNOSIS — E785 Hyperlipidemia, unspecified: Secondary | ICD-10-CM | POA: Diagnosis not present

## 2020-10-29 DIAGNOSIS — Z483 Aftercare following surgery for neoplasm: Secondary | ICD-10-CM | POA: Diagnosis not present

## 2020-10-29 DIAGNOSIS — C4922 Malignant neoplasm of connective and soft tissue of left lower limb, including hip: Secondary | ICD-10-CM | POA: Diagnosis not present

## 2020-10-29 DIAGNOSIS — I129 Hypertensive chronic kidney disease with stage 1 through stage 4 chronic kidney disease, or unspecified chronic kidney disease: Secondary | ICD-10-CM | POA: Diagnosis not present

## 2020-10-29 DIAGNOSIS — N183 Chronic kidney disease, stage 3 unspecified: Secondary | ICD-10-CM | POA: Diagnosis not present

## 2020-10-29 DIAGNOSIS — D631 Anemia in chronic kidney disease: Secondary | ICD-10-CM | POA: Diagnosis not present

## 2020-10-29 DIAGNOSIS — N400 Enlarged prostate without lower urinary tract symptoms: Secondary | ICD-10-CM | POA: Diagnosis not present

## 2020-10-31 DIAGNOSIS — G4730 Sleep apnea, unspecified: Secondary | ICD-10-CM | POA: Diagnosis not present

## 2020-10-31 DIAGNOSIS — N183 Chronic kidney disease, stage 3 unspecified: Secondary | ICD-10-CM | POA: Diagnosis not present

## 2020-10-31 DIAGNOSIS — N400 Enlarged prostate without lower urinary tract symptoms: Secondary | ICD-10-CM | POA: Diagnosis not present

## 2020-10-31 DIAGNOSIS — I739 Peripheral vascular disease, unspecified: Secondary | ICD-10-CM | POA: Diagnosis not present

## 2020-10-31 DIAGNOSIS — D631 Anemia in chronic kidney disease: Secondary | ICD-10-CM | POA: Diagnosis not present

## 2020-10-31 DIAGNOSIS — C4922 Malignant neoplasm of connective and soft tissue of left lower limb, including hip: Secondary | ICD-10-CM | POA: Diagnosis not present

## 2020-10-31 DIAGNOSIS — Z483 Aftercare following surgery for neoplasm: Secondary | ICD-10-CM | POA: Diagnosis not present

## 2020-10-31 DIAGNOSIS — I129 Hypertensive chronic kidney disease with stage 1 through stage 4 chronic kidney disease, or unspecified chronic kidney disease: Secondary | ICD-10-CM | POA: Diagnosis not present

## 2020-10-31 DIAGNOSIS — E785 Hyperlipidemia, unspecified: Secondary | ICD-10-CM | POA: Diagnosis not present

## 2020-11-04 DIAGNOSIS — Z483 Aftercare following surgery for neoplasm: Secondary | ICD-10-CM | POA: Diagnosis not present

## 2020-11-04 DIAGNOSIS — N400 Enlarged prostate without lower urinary tract symptoms: Secondary | ICD-10-CM | POA: Diagnosis not present

## 2020-11-04 DIAGNOSIS — N183 Chronic kidney disease, stage 3 unspecified: Secondary | ICD-10-CM | POA: Diagnosis not present

## 2020-11-04 DIAGNOSIS — C4922 Malignant neoplasm of connective and soft tissue of left lower limb, including hip: Secondary | ICD-10-CM | POA: Diagnosis not present

## 2020-11-04 DIAGNOSIS — G4730 Sleep apnea, unspecified: Secondary | ICD-10-CM | POA: Diagnosis not present

## 2020-11-04 DIAGNOSIS — I739 Peripheral vascular disease, unspecified: Secondary | ICD-10-CM | POA: Diagnosis not present

## 2020-11-04 DIAGNOSIS — I129 Hypertensive chronic kidney disease with stage 1 through stage 4 chronic kidney disease, or unspecified chronic kidney disease: Secondary | ICD-10-CM | POA: Diagnosis not present

## 2020-11-04 DIAGNOSIS — D631 Anemia in chronic kidney disease: Secondary | ICD-10-CM | POA: Diagnosis not present

## 2020-11-04 DIAGNOSIS — E785 Hyperlipidemia, unspecified: Secondary | ICD-10-CM | POA: Diagnosis not present

## 2020-11-06 DIAGNOSIS — I739 Peripheral vascular disease, unspecified: Secondary | ICD-10-CM | POA: Diagnosis not present

## 2020-11-06 DIAGNOSIS — G4730 Sleep apnea, unspecified: Secondary | ICD-10-CM | POA: Diagnosis not present

## 2020-11-06 DIAGNOSIS — C4922 Malignant neoplasm of connective and soft tissue of left lower limb, including hip: Secondary | ICD-10-CM | POA: Diagnosis not present

## 2020-11-06 DIAGNOSIS — N400 Enlarged prostate without lower urinary tract symptoms: Secondary | ICD-10-CM | POA: Diagnosis not present

## 2020-11-06 DIAGNOSIS — N183 Chronic kidney disease, stage 3 unspecified: Secondary | ICD-10-CM | POA: Diagnosis not present

## 2020-11-06 DIAGNOSIS — D631 Anemia in chronic kidney disease: Secondary | ICD-10-CM | POA: Diagnosis not present

## 2020-11-06 DIAGNOSIS — Z483 Aftercare following surgery for neoplasm: Secondary | ICD-10-CM | POA: Diagnosis not present

## 2020-11-06 DIAGNOSIS — I129 Hypertensive chronic kidney disease with stage 1 through stage 4 chronic kidney disease, or unspecified chronic kidney disease: Secondary | ICD-10-CM | POA: Diagnosis not present

## 2020-11-06 DIAGNOSIS — E785 Hyperlipidemia, unspecified: Secondary | ICD-10-CM | POA: Diagnosis not present

## 2020-11-08 DIAGNOSIS — Z4789 Encounter for other orthopedic aftercare: Secondary | ICD-10-CM | POA: Diagnosis not present

## 2020-11-08 DIAGNOSIS — C4922 Malignant neoplasm of connective and soft tissue of left lower limb, including hip: Secondary | ICD-10-CM | POA: Diagnosis not present

## 2020-11-08 DIAGNOSIS — M6281 Muscle weakness (generalized): Secondary | ICD-10-CM | POA: Diagnosis not present

## 2020-11-08 DIAGNOSIS — R262 Difficulty in walking, not elsewhere classified: Secondary | ICD-10-CM | POA: Diagnosis not present

## 2020-12-03 DIAGNOSIS — Z89512 Acquired absence of left leg below knee: Secondary | ICD-10-CM | POA: Diagnosis not present

## 2020-12-08 ENCOUNTER — Other Ambulatory Visit: Payer: Self-pay | Admitting: Student

## 2020-12-08 DIAGNOSIS — D7589 Other specified diseases of blood and blood-forming organs: Secondary | ICD-10-CM

## 2020-12-11 ENCOUNTER — Emergency Department (HOSPITAL_COMMUNITY): Payer: Medicare HMO

## 2020-12-11 ENCOUNTER — Emergency Department (HOSPITAL_COMMUNITY)
Admission: EM | Admit: 2020-12-11 | Discharge: 2020-12-11 | Disposition: A | Discharge status: Home / Self Care | Payer: Medicare HMO | Attending: Emergency Medicine | Admitting: Emergency Medicine

## 2020-12-11 ENCOUNTER — Encounter (HOSPITAL_COMMUNITY): Payer: Self-pay | Admitting: Emergency Medicine

## 2020-12-11 ENCOUNTER — Other Ambulatory Visit: Payer: Self-pay

## 2020-12-11 ENCOUNTER — Ambulatory Visit (HOSPITAL_COMMUNITY)
Admission: EM | Admit: 2020-12-11 | Discharge: 2020-12-11 | Disposition: A | Discharge status: Home / Self Care | Payer: Medicare HMO | Attending: Emergency Medicine | Admitting: Emergency Medicine

## 2020-12-11 ENCOUNTER — Emergency Department (HOSPITAL_BASED_OUTPATIENT_CLINIC_OR_DEPARTMENT_OTHER)
Admit: 2020-12-11 | Discharge: 2020-12-11 | Disposition: A | Discharge status: Home / Self Care | Payer: Medicare HMO | Attending: Emergency Medicine | Admitting: Emergency Medicine

## 2020-12-11 DIAGNOSIS — Z79899 Other long term (current) drug therapy: Secondary | ICD-10-CM | POA: Insufficient documentation

## 2020-12-11 DIAGNOSIS — I10 Essential (primary) hypertension: Secondary | ICD-10-CM | POA: Diagnosis not present

## 2020-12-11 DIAGNOSIS — N402 Nodular prostate without lower urinary tract symptoms: Secondary | ICD-10-CM | POA: Diagnosis not present

## 2020-12-11 DIAGNOSIS — N400 Enlarged prostate without lower urinary tract symptoms: Secondary | ICD-10-CM | POA: Diagnosis not present

## 2020-12-11 DIAGNOSIS — R2242 Localized swelling, mass and lump, left lower limb: Secondary | ICD-10-CM | POA: Diagnosis not present

## 2020-12-11 DIAGNOSIS — M7989 Other specified soft tissue disorders: Secondary | ICD-10-CM | POA: Diagnosis not present

## 2020-12-11 DIAGNOSIS — C4922 Malignant neoplasm of connective and soft tissue of left lower limb, including hip: Secondary | ICD-10-CM | POA: Diagnosis not present

## 2020-12-11 DIAGNOSIS — R1909 Other intra-abdominal and pelvic swelling, mass and lump: Secondary | ICD-10-CM | POA: Diagnosis not present

## 2020-12-11 DIAGNOSIS — I700 Atherosclerosis of aorta: Secondary | ICD-10-CM | POA: Diagnosis not present

## 2020-12-11 DIAGNOSIS — C964 Sarcoma of dendritic cells (accessory cells): Secondary | ICD-10-CM | POA: Diagnosis not present

## 2020-12-11 DIAGNOSIS — C499 Malignant neoplasm of connective and soft tissue, unspecified: Secondary | ICD-10-CM

## 2020-12-11 DIAGNOSIS — F17210 Nicotine dependence, cigarettes, uncomplicated: Secondary | ICD-10-CM | POA: Diagnosis not present

## 2020-12-11 LAB — CBC WITH DIFFERENTIAL/PLATELET
Abs Immature Granulocytes: 0.03 10*3/uL (ref 0.00–0.07)
Basophils Absolute: 0.1 10*3/uL (ref 0.0–0.1)
Basophils Relative: 1 %
Eosinophils Absolute: 0.3 10*3/uL (ref 0.0–0.5)
Eosinophils Relative: 6 %
HCT: 29.2 % — ABNORMAL LOW (ref 39.0–52.0)
Hemoglobin: 9 g/dL — ABNORMAL LOW (ref 13.0–17.0)
Immature Granulocytes: 1 %
Lymphocytes Relative: 38 %
Lymphs Abs: 2.1 10*3/uL (ref 0.7–4.0)
MCH: 28.9 pg (ref 26.0–34.0)
MCHC: 30.8 g/dL (ref 30.0–36.0)
MCV: 93.9 fL (ref 80.0–100.0)
Monocytes Absolute: 0.6 10*3/uL (ref 0.1–1.0)
Monocytes Relative: 10 %
Neutro Abs: 2.5 10*3/uL (ref 1.7–7.7)
Neutrophils Relative %: 44 %
Platelets: 259 10*3/uL (ref 150–400)
RBC: 3.11 MIL/uL — ABNORMAL LOW (ref 4.22–5.81)
RDW: 14.5 % (ref 11.5–15.5)
WBC: 5.5 10*3/uL (ref 4.0–10.5)
nRBC: 0 % (ref 0.0–0.2)

## 2020-12-11 LAB — BASIC METABOLIC PANEL
Anion gap: 8 (ref 5–15)
BUN: 14 mg/dL (ref 8–23)
CO2: 23 mmol/L (ref 22–32)
Calcium: 10.1 mg/dL (ref 8.9–10.3)
Chloride: 108 mmol/L (ref 98–111)
Creatinine, Ser: 1.07 mg/dL (ref 0.61–1.24)
GFR, Estimated: >60 mL/min (ref >60)
Glucose, Bld: 107 mg/dL — ABNORMAL HIGH (ref 70–99)
Potassium: 3.6 mmol/L (ref 3.5–5.1)
Sodium: 139 mmol/L (ref 135–145)

## 2020-12-11 LAB — PROTIME-INR
INR: 1.2 (ref 0.8–1.2)
Prothrombin Time: 14.8 seconds (ref 11.4–15.2)

## 2020-12-11 MED ORDER — OXYCODONE-ACETAMINOPHEN 5-325 MG PO TABS: 1.0000 | ORAL_TABLET | Freq: Four times a day (QID) | ORAL | 0 refills | Status: DC | PRN

## 2020-12-11 MED ORDER — OXYCODONE-ACETAMINOPHEN 5-325 MG PO TABS
1.0000 | ORAL_TABLET | Freq: Once | ORAL | Status: AC
  Administered 2020-12-11: 1 via ORAL
  Filled 2020-12-11: qty 1

## 2020-12-11 MED ORDER — IOHEXOL 350 MG/ML SOLN
80.0000 mL | Freq: Once | INTRAVENOUS | Status: AC | PRN
  Administered 2020-12-11: 80 mL via INTRAVENOUS

## 2020-12-11 MED ORDER — FENTANYL CITRATE PF 50 MCG/ML IJ SOSY
50.0000 ug | PREFILLED_SYRINGE | Freq: Once | INTRAMUSCULAR | Status: AC
  Administered 2020-12-11: 50 ug via INTRAVENOUS
  Filled 2020-12-11: qty 1

## 2020-12-11 MED ORDER — SODIUM CHLORIDE (PF) 0.9 % IJ SOLN
INTRAMUSCULAR | Status: AC
  Filled 2020-12-11: qty 50

## 2020-12-11 NOTE — Progress Notes (Signed)
Left lower extremity venous duplex has been completed. Preliminary results can be found in CV Proc through chart review.  Results were given to Dr. Karle Starch.  12/11/20 7:39 PM Bryan Vincent RVT

## 2020-12-11 NOTE — ED Triage Notes (Signed)
Left leg is swollen.  First noticed swelling one week.  Noticed swelling when being sized for prosthetic.  Patient is a bka .  Patient had this surgery in July of 2022    Patient has swelling up to groin
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
## 2020-12-11 NOTE — Discharge Instructions (Addendum)
I am concerned that this could be a blood clot or infection in your leg.  Go immediately to the emergency department.

## 2020-12-11 NOTE — ED Notes (Signed)
Pt transported to CT ?

## 2020-12-11 NOTE — ED Notes (Signed)
Patient is being discharged from the Urgent Care and sent to the Emergency Department via pov . Per Dr Alphonzo Cruise, patient is in need of higher level of care due to presentation and complaint out of the range of services at [facility]. Patient is aware and verbalizes understanding of plan of care.  Vitals:   12/11/20 1712  BP: 130/69  Pulse: 90  Resp: (!) 22  Temp: 97.9 F (36.6 C)  SpO2: 100%

## 2020-12-12 ENCOUNTER — Other Ambulatory Visit (HOSPITAL_COMMUNITY): Payer: Self-pay | Admitting: Orthopedic Surgery

## 2020-12-15 ENCOUNTER — Other Ambulatory Visit: Payer: Self-pay | Admitting: Orthopedic Surgery

## 2020-12-15 ENCOUNTER — Other Ambulatory Visit (HOSPITAL_COMMUNITY): Payer: Self-pay | Admitting: Orthopedic Surgery

## 2020-12-15 DIAGNOSIS — C4922 Malignant neoplasm of connective and soft tissue of left lower limb, including hip: Secondary | ICD-10-CM

## 2020-12-16 DIAGNOSIS — R6889 Other general symptoms and signs: Secondary | ICD-10-CM | POA: Diagnosis not present

## 2020-12-16 DIAGNOSIS — C7802 Secondary malignant neoplasm of left lung: Secondary | ICD-10-CM | POA: Diagnosis not present

## 2020-12-16 DIAGNOSIS — C7801 Secondary malignant neoplasm of right lung: Secondary | ICD-10-CM | POA: Diagnosis not present

## 2020-12-16 DIAGNOSIS — F17210 Nicotine dependence, cigarettes, uncomplicated: Secondary | ICD-10-CM | POA: Diagnosis not present

## 2020-12-16 DIAGNOSIS — K7689 Other specified diseases of liver: Secondary | ICD-10-CM | POA: Diagnosis not present

## 2020-12-16 DIAGNOSIS — C4922 Malignant neoplasm of connective and soft tissue of left lower limb, including hip: Secondary | ICD-10-CM | POA: Diagnosis not present

## 2020-12-16 DIAGNOSIS — R918 Other nonspecific abnormal finding of lung field: Secondary | ICD-10-CM | POA: Diagnosis not present

## 2020-12-16 DIAGNOSIS — R160 Hepatomegaly, not elsewhere classified: Secondary | ICD-10-CM | POA: Diagnosis not present

## 2020-12-16 DIAGNOSIS — I700 Atherosclerosis of aorta: Secondary | ICD-10-CM | POA: Diagnosis not present

## 2020-12-16 DIAGNOSIS — R2242 Localized swelling, mass and lump, left lower limb: Secondary | ICD-10-CM | POA: Diagnosis not present

## 2020-12-16 DIAGNOSIS — Z89512 Acquired absence of left leg below knee: Secondary | ICD-10-CM | POA: Diagnosis not present

## 2020-12-18 ENCOUNTER — Emergency Department (HOSPITAL_COMMUNITY)
Admission: EM | Admit: 2020-12-18 | Discharge: 2020-12-18 | Disposition: A | Discharge status: Home / Self Care | Payer: Medicare HMO | Attending: Emergency Medicine | Admitting: Emergency Medicine

## 2020-12-18 ENCOUNTER — Encounter (HOSPITAL_COMMUNITY): Payer: Self-pay

## 2020-12-18 ENCOUNTER — Other Ambulatory Visit: Payer: Self-pay

## 2020-12-18 DIAGNOSIS — M79605 Pain in left leg: Secondary | ICD-10-CM | POA: Diagnosis present

## 2020-12-18 DIAGNOSIS — I10 Essential (primary) hypertension: Secondary | ICD-10-CM | POA: Diagnosis not present

## 2020-12-18 DIAGNOSIS — C964 Sarcoma of dendritic cells (accessory cells): Secondary | ICD-10-CM | POA: Diagnosis not present

## 2020-12-18 DIAGNOSIS — C499 Malignant neoplasm of connective and soft tissue, unspecified: Secondary | ICD-10-CM

## 2020-12-18 DIAGNOSIS — F17210 Nicotine dependence, cigarettes, uncomplicated: Secondary | ICD-10-CM | POA: Insufficient documentation

## 2020-12-18 DIAGNOSIS — Z79899 Other long term (current) drug therapy: Secondary | ICD-10-CM | POA: Insufficient documentation

## 2020-12-18 LAB — BASIC METABOLIC PANEL
Anion gap: 11 (ref 5–15)
BUN: 15 mg/dL (ref 8–23)
CO2: 20 mmol/L — ABNORMAL LOW (ref 22–32)
Calcium: 10 mg/dL (ref 8.9–10.3)
Chloride: 107 mmol/L (ref 98–111)
Creatinine, Ser: 1.13 mg/dL (ref 0.61–1.24)
GFR, Estimated: >60 mL/min (ref >60)
Glucose, Bld: 79 mg/dL (ref 70–99)
Potassium: 3.3 mmol/L — ABNORMAL LOW (ref 3.5–5.1)
Sodium: 138 mmol/L (ref 135–145)

## 2020-12-18 LAB — CBC WITH DIFFERENTIAL/PLATELET
Abs Immature Granulocytes: 0.01 10*3/uL (ref 0.00–0.07)
Basophils Absolute: 0.1 10*3/uL (ref 0.0–0.1)
Basophils Relative: 1 %
Eosinophils Absolute: 0.4 10*3/uL (ref 0.0–0.5)
Eosinophils Relative: 7 %
HCT: 29.4 % — ABNORMAL LOW (ref 39.0–52.0)
Hemoglobin: 9.1 g/dL — ABNORMAL LOW (ref 13.0–17.0)
Immature Granulocytes: 0 %
Lymphocytes Relative: 35 %
Lymphs Abs: 2 10*3/uL (ref 0.7–4.0)
MCH: 28.9 pg (ref 26.0–34.0)
MCHC: 31 g/dL (ref 30.0–36.0)
MCV: 93.3 fL (ref 80.0–100.0)
Monocytes Absolute: 0.5 10*3/uL (ref 0.1–1.0)
Monocytes Relative: 9 %
Neutro Abs: 2.7 10*3/uL (ref 1.7–7.7)
Neutrophils Relative %: 48 %
Platelets: 305 10*3/uL (ref 150–400)
RBC: 3.15 MIL/uL — ABNORMAL LOW (ref 4.22–5.81)
RDW: 14.7 % (ref 11.5–15.5)
WBC: 5.6 10*3/uL (ref 4.0–10.5)
nRBC: 0 % (ref 0.0–0.2)

## 2020-12-18 MED ORDER — OXYCODONE-ACETAMINOPHEN 5-325 MG PO TABS: 1.0000 | ORAL_TABLET | Freq: Four times a day (QID) | ORAL | 0 refills | Status: AC | PRN

## 2020-12-18 MED ORDER — OXYCODONE-ACETAMINOPHEN 5-325 MG PO TABS
2.0000 | ORAL_TABLET | Freq: Once | ORAL | Status: AC
  Administered 2020-12-18: 2 via ORAL
  Filled 2020-12-18: qty 2
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
## 2020-12-18 NOTE — ED Triage Notes (Signed)
Pt here for pain/swelling/tenderness to left leg. 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 BKA surgery was July 2022. This has been ongoing for some time per pt.

## 2020-12-18 NOTE — ED Provider Notes (Signed)
Emergency Medicine Provider Triage Evaluation Note  Bryan Vincent , a 75 y.o. male  was evaluated in triage.  Pt complains of swelling to the left leg.  Review of Systems  Positive: Left leg swelling Negative: fever  Physical Exam  BP 122/68 (BP Location: Left Arm)   Pulse 97   Temp 98.3 F (36.8 C) (Oral)   Resp 18   SpO2 100%  Gen:   Awake, no distress   Resp:  Normal effort  MSK:   Moves extremities without difficulty  Other:    Medical Decision Making  Medically screening exam initiated at 5:50 PM.  Appropriate orders placed.  Bryan Vincent was informed that the remainder of the evaluation will be completed by another provider, this initial triage assessment does not replace that evaluation, and the importance of remaining in the ED until their evaluation is complete.     Rodney Booze, PA-C 12/18/20 1753    Davonna Belling, MD 12/19/20 [phone]

## 2020-12-22 ENCOUNTER — Ambulatory Visit (HOSPITAL_BASED_OUTPATIENT_CLINIC_OR_DEPARTMENT_OTHER): Payer: Medicare HMO

## 2020-12-23 DIAGNOSIS — Z89512 Acquired absence of left leg below knee: Secondary | ICD-10-CM | POA: Diagnosis not present

## 2020-12-23 DIAGNOSIS — G893 Neoplasm related pain (acute) (chronic): Secondary | ICD-10-CM | POA: Diagnosis not present

## 2020-12-23 DIAGNOSIS — C7652 Malignant neoplasm of left lower limb: Secondary | ICD-10-CM | POA: Diagnosis not present

## 2020-12-23 DIAGNOSIS — R5381 Other malaise: Secondary | ICD-10-CM | POA: Diagnosis not present

## 2020-12-23 DIAGNOSIS — T402X5A Adverse effect of other opioids, initial encounter: Secondary | ICD-10-CM | POA: Diagnosis not present

## 2020-12-23 DIAGNOSIS — E538 Deficiency of other specified B group vitamins: Secondary | ICD-10-CM | POA: Diagnosis not present

## 2020-12-23 DIAGNOSIS — I10 Essential (primary) hypertension: Secondary | ICD-10-CM | POA: Diagnosis not present

## 2020-12-23 DIAGNOSIS — Z79891 Long term (current) use of opiate analgesic: Secondary | ICD-10-CM | POA: Diagnosis not present

## 2020-12-23 DIAGNOSIS — D649 Anemia, unspecified: Secondary | ICD-10-CM | POA: Diagnosis not present

## 2020-12-23 DIAGNOSIS — Z5900 Homelessness unspecified: Secondary | ICD-10-CM | POA: Diagnosis not present

## 2020-12-23 DIAGNOSIS — K5903 Drug induced constipation: Secondary | ICD-10-CM | POA: Diagnosis not present

## 2020-12-23 DIAGNOSIS — C499 Malignant neoplasm of connective and soft tissue, unspecified: Secondary | ICD-10-CM | POA: Diagnosis not present

## 2020-12-23 DIAGNOSIS — F1010 Alcohol abuse, uncomplicated: Secondary | ICD-10-CM | POA: Diagnosis not present

## 2020-12-23 DIAGNOSIS — E210 Primary hyperparathyroidism: Secondary | ICD-10-CM | POA: Diagnosis not present

## 2020-12-23 DIAGNOSIS — C4922 Malignant neoplasm of connective and soft tissue of left lower limb, including hip: Secondary | ICD-10-CM | POA: Diagnosis not present

## 2020-12-23 DIAGNOSIS — Z7289 Other problems related to lifestyle: Secondary | ICD-10-CM | POA: Diagnosis not present

## 2020-12-23 DIAGNOSIS — E875 Hyperkalemia: Secondary | ICD-10-CM | POA: Diagnosis not present

## 2020-12-23 DIAGNOSIS — F17210 Nicotine dependence, cigarettes, uncomplicated: Secondary | ICD-10-CM | POA: Diagnosis not present

## 2020-12-23 DIAGNOSIS — N189 Chronic kidney disease, unspecified: Secondary | ICD-10-CM | POA: Diagnosis not present

## 2020-12-23 DIAGNOSIS — Z515 Encounter for palliative care: Secondary | ICD-10-CM | POA: Diagnosis not present

## 2020-12-23 DIAGNOSIS — C7800 Secondary malignant neoplasm of unspecified lung: Secondary | ICD-10-CM | POA: Diagnosis not present

## 2020-12-23 DIAGNOSIS — C7802 Secondary malignant neoplasm of left lung: Secondary | ICD-10-CM | POA: Diagnosis not present

## 2020-12-23 DIAGNOSIS — R9431 Abnormal electrocardiogram [ECG] [EKG]: Secondary | ICD-10-CM | POA: Diagnosis not present

## 2020-12-23 DIAGNOSIS — C7801 Secondary malignant neoplasm of right lung: Secondary | ICD-10-CM | POA: Diagnosis not present

## 2020-12-23 DIAGNOSIS — D509 Iron deficiency anemia, unspecified: Secondary | ICD-10-CM | POA: Diagnosis not present

## 2020-12-23 DIAGNOSIS — C801 Malignant (primary) neoplasm, unspecified: Secondary | ICD-10-CM | POA: Diagnosis not present

## 2020-12-23 DIAGNOSIS — E46 Unspecified protein-calorie malnutrition: Secondary | ICD-10-CM | POA: Diagnosis not present

## 2020-12-23 DIAGNOSIS — I129 Hypertensive chronic kidney disease with stage 1 through stage 4 chronic kidney disease, or unspecified chronic kidney disease: Secondary | ICD-10-CM | POA: Diagnosis not present

## 2020-12-23 DIAGNOSIS — E213 Hyperparathyroidism, unspecified: Secondary | ICD-10-CM | POA: Diagnosis not present

## 2020-12-23 DIAGNOSIS — R079 Chest pain, unspecified: Secondary | ICD-10-CM | POA: Diagnosis not present

## 2020-12-23 DIAGNOSIS — E871 Hypo-osmolality and hyponatremia: Secondary | ICD-10-CM | POA: Diagnosis not present

## 2020-12-23 DIAGNOSIS — R6889 Other general symptoms and signs: Secondary | ICD-10-CM | POA: Diagnosis not present

## 2021-01-07 DIAGNOSIS — F1010 Alcohol abuse, uncomplicated: Secondary | ICD-10-CM | POA: Diagnosis not present

## 2021-01-07 DIAGNOSIS — C7800 Secondary malignant neoplasm of unspecified lung: Secondary | ICD-10-CM | POA: Diagnosis not present

## 2021-01-07 DIAGNOSIS — D630 Anemia in neoplastic disease: Secondary | ICD-10-CM | POA: Diagnosis not present

## 2021-01-07 DIAGNOSIS — C4922 Malignant neoplasm of connective and soft tissue of left lower limb, including hip: Secondary | ICD-10-CM | POA: Diagnosis not present

## 2021-01-07 DIAGNOSIS — C44221 Squamous cell carcinoma of skin of unspecified ear and external auricular canal: Secondary | ICD-10-CM | POA: Diagnosis not present

## 2021-01-07 DIAGNOSIS — R600 Localized edema: Secondary | ICD-10-CM | POA: Diagnosis not present

## 2021-01-07 DIAGNOSIS — M25571 Pain in right ankle and joints of right foot: Secondary | ICD-10-CM | POA: Diagnosis not present

## 2021-01-07 DIAGNOSIS — R197 Diarrhea, unspecified: Secondary | ICD-10-CM | POA: Diagnosis not present

## 2021-01-07 DIAGNOSIS — Z79899 Other long term (current) drug therapy: Secondary | ICD-10-CM | POA: Diagnosis not present

## 2021-01-07 DIAGNOSIS — I10 Essential (primary) hypertension: Secondary | ICD-10-CM | POA: Diagnosis not present

## 2021-01-07 DIAGNOSIS — R918 Other nonspecific abnormal finding of lung field: Secondary | ICD-10-CM | POA: Diagnosis not present

## 2021-01-07 DIAGNOSIS — D509 Iron deficiency anemia, unspecified: Secondary | ICD-10-CM | POA: Diagnosis not present

## 2021-01-07 DIAGNOSIS — M7989 Other specified soft tissue disorders: Secondary | ICD-10-CM | POA: Diagnosis not present

## 2021-01-07 DIAGNOSIS — I129 Hypertensive chronic kidney disease with stage 1 through stage 4 chronic kidney disease, or unspecified chronic kidney disease: Secondary | ICD-10-CM | POA: Diagnosis not present

## 2021-01-07 DIAGNOSIS — Z89512 Acquired absence of left leg below knee: Secondary | ICD-10-CM | POA: Diagnosis not present

## 2021-01-07 DIAGNOSIS — C7652 Malignant neoplasm of left lower limb: Secondary | ICD-10-CM | POA: Diagnosis not present

## 2021-01-07 DIAGNOSIS — C7802 Secondary malignant neoplasm of left lung: Secondary | ICD-10-CM | POA: Diagnosis not present

## 2021-01-07 DIAGNOSIS — C801 Malignant (primary) neoplasm, unspecified: Secondary | ICD-10-CM | POA: Diagnosis not present

## 2021-01-07 DIAGNOSIS — N189 Chronic kidney disease, unspecified: Secondary | ICD-10-CM | POA: Diagnosis not present

## 2021-01-07 DIAGNOSIS — M79671 Pain in right foot: Secondary | ICD-10-CM | POA: Diagnosis not present

## 2021-01-07 DIAGNOSIS — G893 Neoplasm related pain (acute) (chronic): Secondary | ICD-10-CM | POA: Diagnosis not present

## 2021-01-07 DIAGNOSIS — E213 Hyperparathyroidism, unspecified: Secondary | ICD-10-CM | POA: Diagnosis not present

## 2021-01-07 DIAGNOSIS — A0471 Enterocolitis due to Clostridium difficile, recurrent: Secondary | ICD-10-CM | POA: Diagnosis not present

## 2021-01-07 DIAGNOSIS — I82402 Acute embolism and thrombosis of unspecified deep veins of left lower extremity: Secondary | ICD-10-CM | POA: Diagnosis not present

## 2021-01-07 DIAGNOSIS — C499 Malignant neoplasm of connective and soft tissue, unspecified: Secondary | ICD-10-CM | POA: Diagnosis not present

## 2021-01-07 DIAGNOSIS — Z760 Encounter for issue of repeat prescription: Secondary | ICD-10-CM | POA: Diagnosis not present

## 2021-01-07 DIAGNOSIS — E210 Primary hyperparathyroidism: Secondary | ICD-10-CM | POA: Diagnosis not present

## 2021-01-07 DIAGNOSIS — D649 Anemia, unspecified: Secondary | ICD-10-CM | POA: Diagnosis not present

## 2021-01-07 DIAGNOSIS — E538 Deficiency of other specified B group vitamins: Secondary | ICD-10-CM | POA: Diagnosis not present

## 2021-01-07 DIAGNOSIS — Z1331 Encounter for screening for depression: Secondary | ICD-10-CM | POA: Diagnosis not present

## 2021-01-07 DIAGNOSIS — R601 Generalized edema: Secondary | ICD-10-CM | POA: Diagnosis not present

## 2021-01-08 DIAGNOSIS — Z89512 Acquired absence of left leg below knee: Secondary | ICD-10-CM | POA: Diagnosis not present

## 2021-01-08 DIAGNOSIS — E213 Hyperparathyroidism, unspecified: Secondary | ICD-10-CM | POA: Diagnosis not present

## 2021-01-08 DIAGNOSIS — C499 Malignant neoplasm of connective and soft tissue, unspecified: Secondary | ICD-10-CM | POA: Diagnosis not present

## 2021-01-08 DIAGNOSIS — N189 Chronic kidney disease, unspecified: Secondary | ICD-10-CM | POA: Diagnosis not present

## 2021-01-08 DIAGNOSIS — C7800 Secondary malignant neoplasm of unspecified lung: Secondary | ICD-10-CM | POA: Diagnosis not present

## 2021-01-08 DIAGNOSIS — G893 Neoplasm related pain (acute) (chronic): Secondary | ICD-10-CM | POA: Diagnosis not present

## 2021-01-13 DIAGNOSIS — C499 Malignant neoplasm of connective and soft tissue, unspecified: Secondary | ICD-10-CM | POA: Diagnosis not present

## 2021-01-13 DIAGNOSIS — D649 Anemia, unspecified: Secondary | ICD-10-CM | POA: Diagnosis not present

## 2021-01-13 DIAGNOSIS — E210 Primary hyperparathyroidism: Secondary | ICD-10-CM | POA: Diagnosis not present

## 2021-01-13 DIAGNOSIS — R918 Other nonspecific abnormal finding of lung field: Secondary | ICD-10-CM | POA: Diagnosis not present

## 2021-01-13 DIAGNOSIS — D630 Anemia in neoplastic disease: Secondary | ICD-10-CM | POA: Diagnosis not present

## 2021-01-13 DIAGNOSIS — Z79899 Other long term (current) drug therapy: Secondary | ICD-10-CM | POA: Diagnosis not present

## 2021-01-13 DIAGNOSIS — C7802 Secondary malignant neoplasm of left lung: Secondary | ICD-10-CM | POA: Diagnosis not present

## 2021-01-13 DIAGNOSIS — C7800 Secondary malignant neoplasm of unspecified lung: Secondary | ICD-10-CM | POA: Diagnosis not present

## 2021-01-13 DIAGNOSIS — C4922 Malignant neoplasm of connective and soft tissue of left lower limb, including hip: Secondary | ICD-10-CM | POA: Diagnosis not present

## 2021-01-14 DIAGNOSIS — M25571 Pain in right ankle and joints of right foot: Secondary | ICD-10-CM | POA: Diagnosis not present

## 2021-01-14 DIAGNOSIS — C499 Malignant neoplasm of connective and soft tissue, unspecified: Secondary | ICD-10-CM | POA: Diagnosis not present

## 2021-01-14 DIAGNOSIS — C7800 Secondary malignant neoplasm of unspecified lung: Secondary | ICD-10-CM | POA: Diagnosis not present

## 2021-01-14 DIAGNOSIS — Z89512 Acquired absence of left leg below knee: Secondary | ICD-10-CM | POA: Diagnosis not present

## 2021-01-14 DIAGNOSIS — Z1331 Encounter for screening for depression: Secondary | ICD-10-CM | POA: Diagnosis not present

## 2021-01-15 DIAGNOSIS — M79671 Pain in right foot: Secondary | ICD-10-CM | POA: Diagnosis not present

## 2021-01-15 DIAGNOSIS — M25571 Pain in right ankle and joints of right foot: Secondary | ICD-10-CM | POA: Diagnosis not present

## 2021-01-16 DIAGNOSIS — M25571 Pain in right ankle and joints of right foot: Secondary | ICD-10-CM | POA: Diagnosis not present

## 2021-01-16 DIAGNOSIS — C499 Malignant neoplasm of connective and soft tissue, unspecified: Secondary | ICD-10-CM | POA: Diagnosis not present

## 2021-01-16 DIAGNOSIS — R600 Localized edema: Secondary | ICD-10-CM | POA: Diagnosis not present

## 2021-01-17 ENCOUNTER — Other Ambulatory Visit: Payer: Self-pay | Admitting: Student

## 2021-01-17 DIAGNOSIS — E210 Primary hyperparathyroidism: Secondary | ICD-10-CM

## 2021-01-19 NOTE — Telephone Encounter (Signed)
LOV 09/18/2020 No pending office visits.

## 2021-01-20 DIAGNOSIS — Z89512 Acquired absence of left leg below knee: Secondary | ICD-10-CM | POA: Diagnosis not present

## 2021-01-20 DIAGNOSIS — R600 Localized edema: Secondary | ICD-10-CM | POA: Diagnosis not present

## 2021-01-20 DIAGNOSIS — R197 Diarrhea, unspecified: Secondary | ICD-10-CM | POA: Diagnosis not present

## 2021-01-21 DIAGNOSIS — R601 Generalized edema: Secondary | ICD-10-CM | POA: Diagnosis not present

## 2021-01-22 DIAGNOSIS — Z760 Encounter for issue of repeat prescription: Secondary | ICD-10-CM | POA: Diagnosis not present

## 2021-01-27 DIAGNOSIS — C7800 Secondary malignant neoplasm of unspecified lung: Secondary | ICD-10-CM | POA: Diagnosis not present

## 2021-01-27 DIAGNOSIS — I82402 Acute embolism and thrombosis of unspecified deep veins of left lower extremity: Secondary | ICD-10-CM | POA: Diagnosis not present

## 2021-01-27 DIAGNOSIS — M7989 Other specified soft tissue disorders: Secondary | ICD-10-CM | POA: Diagnosis not present

## 2021-01-27 DIAGNOSIS — C499 Malignant neoplasm of connective and soft tissue, unspecified: Secondary | ICD-10-CM | POA: Diagnosis not present

## 2021-01-29 DIAGNOSIS — C7800 Secondary malignant neoplasm of unspecified lung: Secondary | ICD-10-CM | POA: Diagnosis not present

## 2021-01-29 DIAGNOSIS — I82402 Acute embolism and thrombosis of unspecified deep veins of left lower extremity: Secondary | ICD-10-CM | POA: Diagnosis not present

## 2021-01-29 DIAGNOSIS — G893 Neoplasm related pain (acute) (chronic): Secondary | ICD-10-CM | POA: Diagnosis not present

## 2021-02-03 DIAGNOSIS — C7800 Secondary malignant neoplasm of unspecified lung: Secondary | ICD-10-CM | POA: Diagnosis not present

## 2021-02-03 DIAGNOSIS — C44221 Squamous cell carcinoma of skin of unspecified ear and external auricular canal: Secondary | ICD-10-CM | POA: Diagnosis not present

## 2021-02-03 DIAGNOSIS — D649 Anemia, unspecified: Secondary | ICD-10-CM | POA: Diagnosis not present

## 2021-02-03 DIAGNOSIS — N189 Chronic kidney disease, unspecified: Secondary | ICD-10-CM | POA: Diagnosis not present

## 2021-02-05 DIAGNOSIS — N189 Chronic kidney disease, unspecified: Secondary | ICD-10-CM | POA: Diagnosis not present

## 2021-02-05 DIAGNOSIS — M25571 Pain in right ankle and joints of right foot: Secondary | ICD-10-CM | POA: Diagnosis not present

## 2021-02-05 DIAGNOSIS — C499 Malignant neoplasm of connective and soft tissue, unspecified: Secondary | ICD-10-CM | POA: Diagnosis not present

## 2021-02-05 DIAGNOSIS — C7800 Secondary malignant neoplasm of unspecified lung: Secondary | ICD-10-CM | POA: Diagnosis not present

## 2021-02-05 DIAGNOSIS — I82402 Acute embolism and thrombosis of unspecified deep veins of left lower extremity: Secondary | ICD-10-CM | POA: Diagnosis not present

## 2021-02-08 DIAGNOSIS — R0602 Shortness of breath: Secondary | ICD-10-CM | POA: Diagnosis not present

## 2021-02-08 DIAGNOSIS — R110 Nausea: Secondary | ICD-10-CM | POA: Diagnosis not present

## 2021-02-08 DIAGNOSIS — I10 Essential (primary) hypertension: Secondary | ICD-10-CM | POA: Diagnosis not present

## 2021-02-08 DIAGNOSIS — N390 Urinary tract infection, site not specified: Secondary | ICD-10-CM | POA: Diagnosis not present

## 2021-02-08 DIAGNOSIS — R918 Other nonspecific abnormal finding of lung field: Secondary | ICD-10-CM | POA: Diagnosis not present

## 2021-02-08 DIAGNOSIS — G893 Neoplasm related pain (acute) (chronic): Secondary | ICD-10-CM | POA: Diagnosis not present

## 2021-02-08 DIAGNOSIS — R2242 Localized swelling, mass and lump, left lower limb: Secondary | ICD-10-CM | POA: Diagnosis not present

## 2021-02-08 DIAGNOSIS — R52 Pain, unspecified: Secondary | ICD-10-CM | POA: Diagnosis not present

## 2021-02-08 DIAGNOSIS — E210 Primary hyperparathyroidism: Secondary | ICD-10-CM | POA: Diagnosis not present

## 2021-02-08 DIAGNOSIS — N189 Chronic kidney disease, unspecified: Secondary | ICD-10-CM | POA: Diagnosis not present

## 2021-02-08 DIAGNOSIS — E119 Type 2 diabetes mellitus without complications: Secondary | ICD-10-CM | POA: Diagnosis not present

## 2021-02-08 DIAGNOSIS — U071 COVID-19: Secondary | ICD-10-CM | POA: Diagnosis not present

## 2021-02-08 DIAGNOSIS — Z7729 Contact with and (suspected ) exposure to other hazardous substances: Secondary | ICD-10-CM | POA: Diagnosis not present

## 2021-02-08 DIAGNOSIS — Z79899 Other long term (current) drug therapy: Secondary | ICD-10-CM | POA: Diagnosis not present

## 2021-02-08 DIAGNOSIS — T451X5A Adverse effect of antineoplastic and immunosuppressive drugs, initial encounter: Secondary | ICD-10-CM | POA: Diagnosis not present

## 2021-02-08 DIAGNOSIS — Z89512 Acquired absence of left leg below knee: Secondary | ICD-10-CM | POA: Diagnosis not present

## 2021-02-08 DIAGNOSIS — C4922 Malignant neoplasm of connective and soft tissue of left lower limb, including hip: Secondary | ICD-10-CM | POA: Diagnosis not present

## 2021-02-08 DIAGNOSIS — R42 Dizziness and giddiness: Secondary | ICD-10-CM | POA: Diagnosis not present

## 2021-02-08 DIAGNOSIS — M79604 Pain in right leg: Secondary | ICD-10-CM | POA: Diagnosis not present

## 2021-02-08 DIAGNOSIS — D510 Vitamin B12 deficiency anemia due to intrinsic factor deficiency: Secondary | ICD-10-CM | POA: Diagnosis not present

## 2021-02-08 DIAGNOSIS — Z9221 Personal history of antineoplastic chemotherapy: Secondary | ICD-10-CM | POA: Diagnosis not present

## 2021-02-08 DIAGNOSIS — C499 Malignant neoplasm of connective and soft tissue, unspecified: Secondary | ICD-10-CM | POA: Diagnosis not present

## 2021-02-08 DIAGNOSIS — R000 Tachycardia, unspecified: Secondary | ICD-10-CM | POA: Diagnosis not present

## 2021-02-08 DIAGNOSIS — C7800 Secondary malignant neoplasm of unspecified lung: Secondary | ICD-10-CM | POA: Diagnosis not present

## 2021-02-08 DIAGNOSIS — C44709 Unspecified malignant neoplasm of skin of left lower limb, including hip: Secondary | ICD-10-CM | POA: Diagnosis not present

## 2021-02-08 DIAGNOSIS — I129 Hypertensive chronic kidney disease with stage 1 through stage 4 chronic kidney disease, or unspecified chronic kidney disease: Secondary | ICD-10-CM | POA: Diagnosis not present

## 2021-02-08 DIAGNOSIS — E538 Deficiency of other specified B group vitamins: Secondary | ICD-10-CM | POA: Diagnosis not present

## 2021-02-08 DIAGNOSIS — D6859 Other primary thrombophilia: Secondary | ICD-10-CM | POA: Diagnosis not present

## 2021-02-08 DIAGNOSIS — R232 Flushing: Secondary | ICD-10-CM | POA: Diagnosis not present

## 2021-02-08 DIAGNOSIS — Z1331 Encounter for screening for depression: Secondary | ICD-10-CM | POA: Diagnosis not present

## 2021-02-08 DIAGNOSIS — D649 Anemia, unspecified: Secondary | ICD-10-CM | POA: Diagnosis not present

## 2021-02-08 DIAGNOSIS — R55 Syncope and collapse: Secondary | ICD-10-CM | POA: Diagnosis not present

## 2021-02-08 DIAGNOSIS — C7802 Secondary malignant neoplasm of left lung: Secondary | ICD-10-CM | POA: Diagnosis not present

## 2021-02-08 DIAGNOSIS — I82402 Acute embolism and thrombosis of unspecified deep veins of left lower extremity: Secondary | ICD-10-CM | POA: Diagnosis not present

## 2021-02-08 DIAGNOSIS — R0600 Dyspnea, unspecified: Secondary | ICD-10-CM | POA: Diagnosis not present

## 2021-02-10 DIAGNOSIS — D649 Anemia, unspecified: Secondary | ICD-10-CM | POA: Diagnosis not present

## 2021-02-10 DIAGNOSIS — M79604 Pain in right leg: Secondary | ICD-10-CM | POA: Diagnosis not present

## 2021-02-10 DIAGNOSIS — R2242 Localized swelling, mass and lump, left lower limb: Secondary | ICD-10-CM | POA: Diagnosis not present

## 2021-02-10 DIAGNOSIS — C44709 Unspecified malignant neoplasm of skin of left lower limb, including hip: Secondary | ICD-10-CM | POA: Diagnosis not present

## 2021-02-12 DIAGNOSIS — T451X5A Adverse effect of antineoplastic and immunosuppressive drugs, initial encounter: Secondary | ICD-10-CM | POA: Diagnosis not present

## 2021-02-12 DIAGNOSIS — R000 Tachycardia, unspecified: Secondary | ICD-10-CM | POA: Diagnosis not present

## 2021-02-12 DIAGNOSIS — R42 Dizziness and giddiness: Secondary | ICD-10-CM | POA: Diagnosis not present

## 2021-02-12 DIAGNOSIS — R0600 Dyspnea, unspecified: Secondary | ICD-10-CM | POA: Diagnosis not present

## 2021-02-12 DIAGNOSIS — C4922 Malignant neoplasm of connective and soft tissue of left lower limb, including hip: Secondary | ICD-10-CM | POA: Diagnosis not present

## 2021-02-12 DIAGNOSIS — R55 Syncope and collapse: Secondary | ICD-10-CM | POA: Diagnosis not present

## 2021-02-12 DIAGNOSIS — R110 Nausea: Secondary | ICD-10-CM | POA: Diagnosis not present

## 2021-02-12 DIAGNOSIS — R232 Flushing: Secondary | ICD-10-CM | POA: Diagnosis not present

## 2021-02-12 DIAGNOSIS — I10 Essential (primary) hypertension: Secondary | ICD-10-CM | POA: Diagnosis not present

## 2021-02-12 DIAGNOSIS — C7802 Secondary malignant neoplasm of left lung: Secondary | ICD-10-CM | POA: Diagnosis not present

## 2021-02-12 DIAGNOSIS — R0602 Shortness of breath: Secondary | ICD-10-CM | POA: Diagnosis not present

## 2021-02-12 DIAGNOSIS — C7800 Secondary malignant neoplasm of unspecified lung: Secondary | ICD-10-CM | POA: Diagnosis not present

## 2021-02-16 DIAGNOSIS — I82402 Acute embolism and thrombosis of unspecified deep veins of left lower extremity: Secondary | ICD-10-CM | POA: Diagnosis not present

## 2021-02-16 DIAGNOSIS — Z1331 Encounter for screening for depression: Secondary | ICD-10-CM | POA: Diagnosis not present

## 2021-02-16 DIAGNOSIS — C7800 Secondary malignant neoplasm of unspecified lung: Secondary | ICD-10-CM | POA: Diagnosis not present

## 2021-02-16 DIAGNOSIS — N189 Chronic kidney disease, unspecified: Secondary | ICD-10-CM | POA: Diagnosis not present

## 2021-02-16 DIAGNOSIS — G893 Neoplasm related pain (acute) (chronic): Secondary | ICD-10-CM | POA: Diagnosis not present

## 2021-02-24 ENCOUNTER — Encounter: Payer: Medicare HMO | Admitting: Internal Medicine

## 2021-02-26 DIAGNOSIS — Z7729 Contact with and (suspected ) exposure to other hazardous substances: Secondary | ICD-10-CM | POA: Diagnosis not present

## 2021-02-26 DIAGNOSIS — U071 COVID-19: Secondary | ICD-10-CM | POA: Diagnosis not present

## 2021-02-27 DIAGNOSIS — N390 Urinary tract infection, site not specified: Secondary | ICD-10-CM | POA: Diagnosis not present

## 2021-03-12 DIAGNOSIS — D6859 Other primary thrombophilia: Secondary | ICD-10-CM | POA: Diagnosis not present

## 2021-03-12 DIAGNOSIS — C499 Malignant neoplasm of connective and soft tissue, unspecified: Secondary | ICD-10-CM | POA: Diagnosis not present

## 2021-03-12 DIAGNOSIS — C7800 Secondary malignant neoplasm of unspecified lung: Secondary | ICD-10-CM | POA: Diagnosis not present

## 2021-03-12 DIAGNOSIS — D649 Anemia, unspecified: Secondary | ICD-10-CM | POA: Diagnosis not present

## 2021-03-13 IMAGING — MR MR [PERSON_NAME] LOW WO/W CM*L*
2 of 11 series · 5 of 40 positions shown · IV contrast (gadavist)
Comparison: Ultrasound 09/27/2019

CLINICAL DATA: Left lower extremity soft tissue mass, solid with
internal blood flow on ultrasound. No given history of malignancy.

EXAM:
MRI OF LOWER LEFT EXTREMITY WITHOUT AND WITH CONTRAST
TECHNIQUE: Multiplanar, multisequence MR imaging of the left lower leg was
performed both before and after administration of intravenous
contrast.
CONTRAST:  6.8mL GADAVIST GADOBUTROL 1 MMOL/ML IV SOLN

[Series 2: T1 · coronal · 4.0mm · 0.43mm/px · 2 of 25 slices shown (1 of 2)]
[im 1/25]
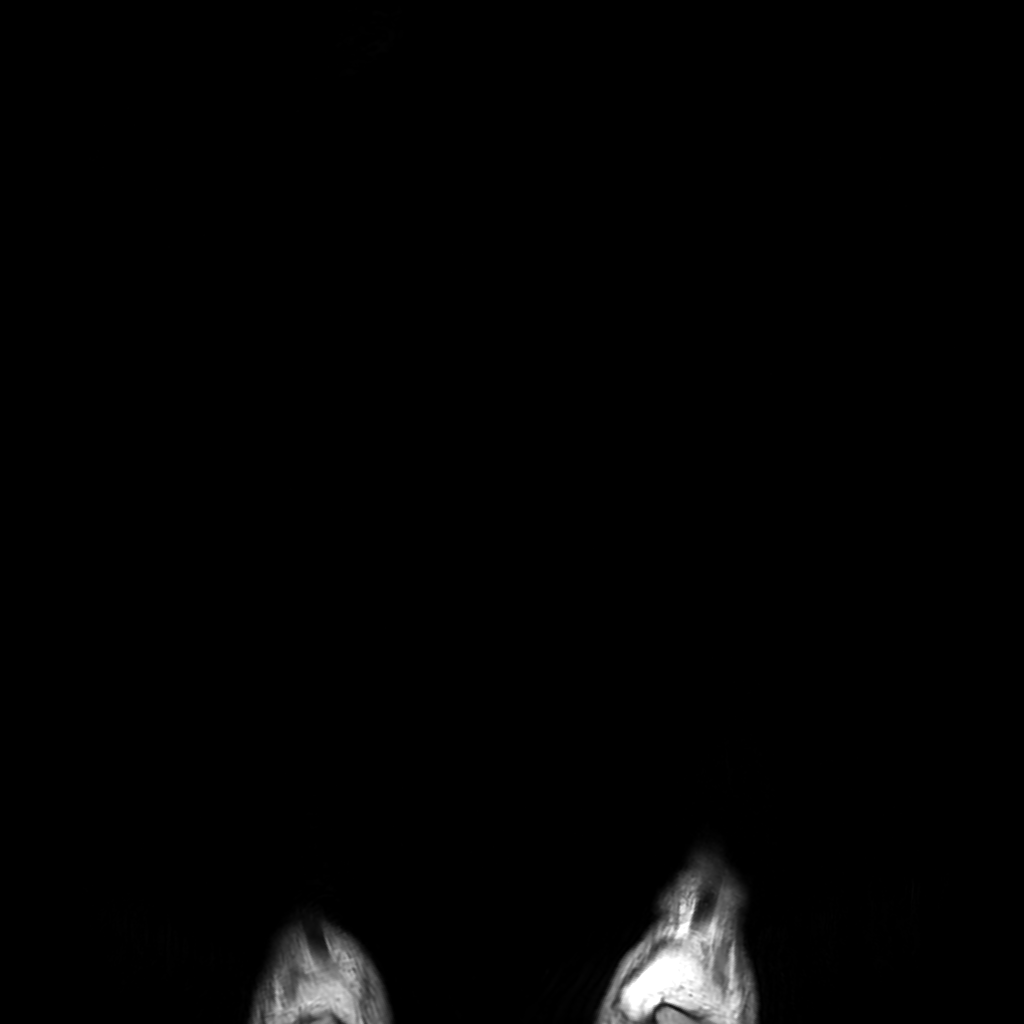
[im 25/25]
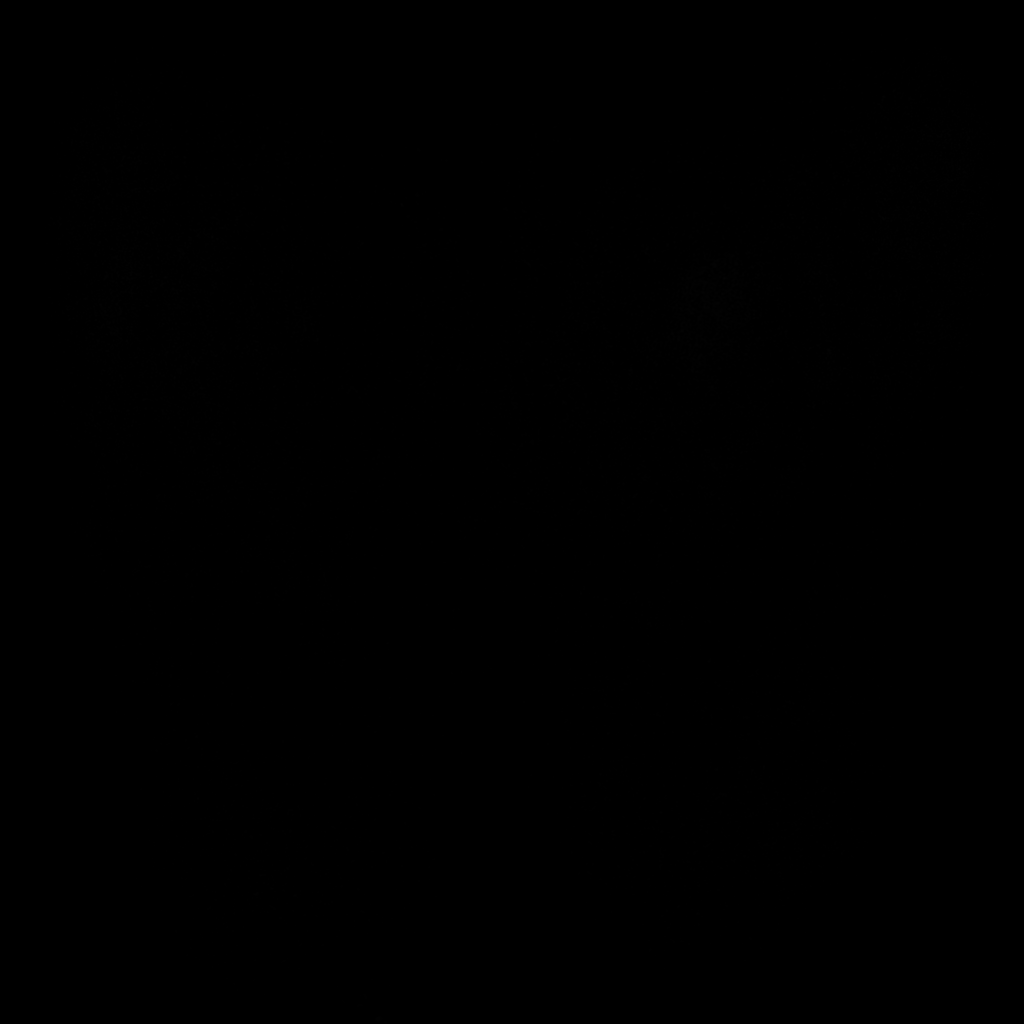

[Series 4: T1 · axial · 4.0mm · 0.25mm/px · z∈[-185,+184]mm · 3 of 75 slices shown (2 of 2)]
[im 1/75]
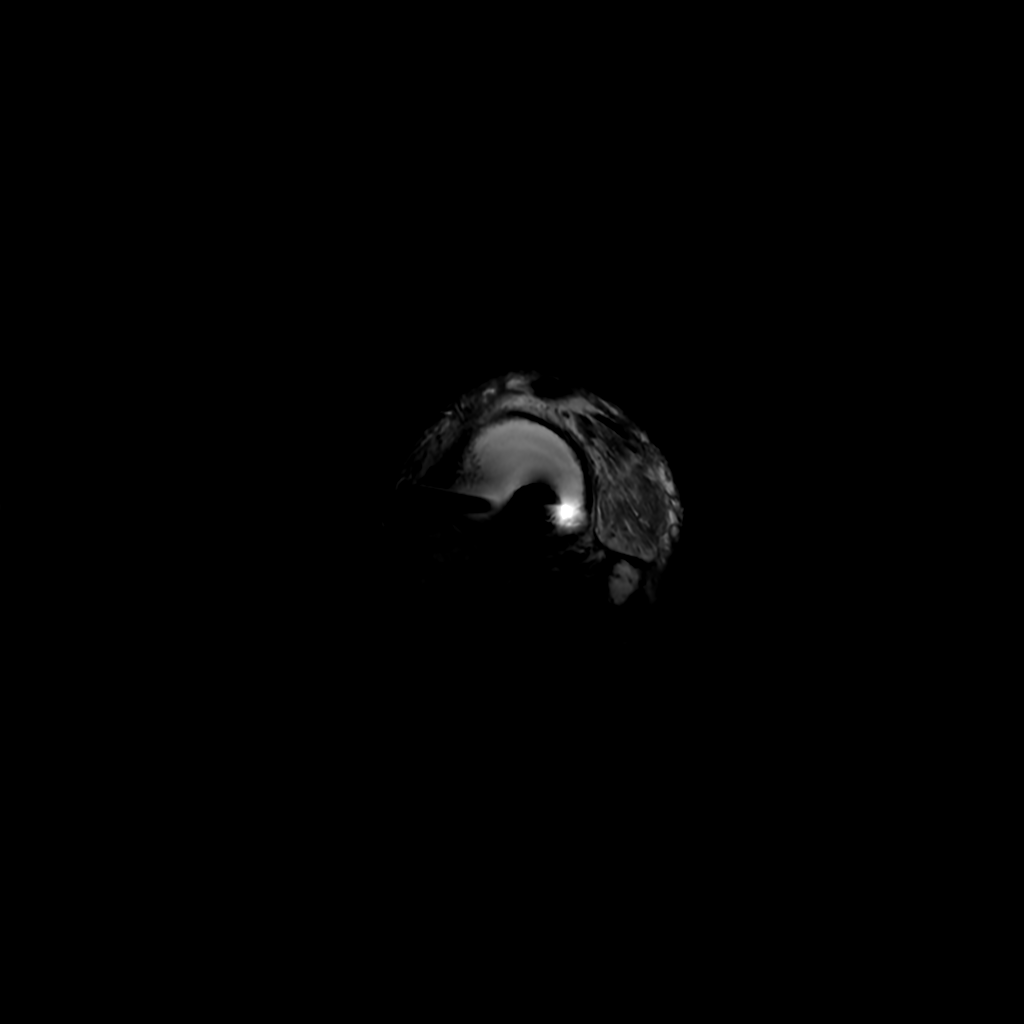
[im 38/75]
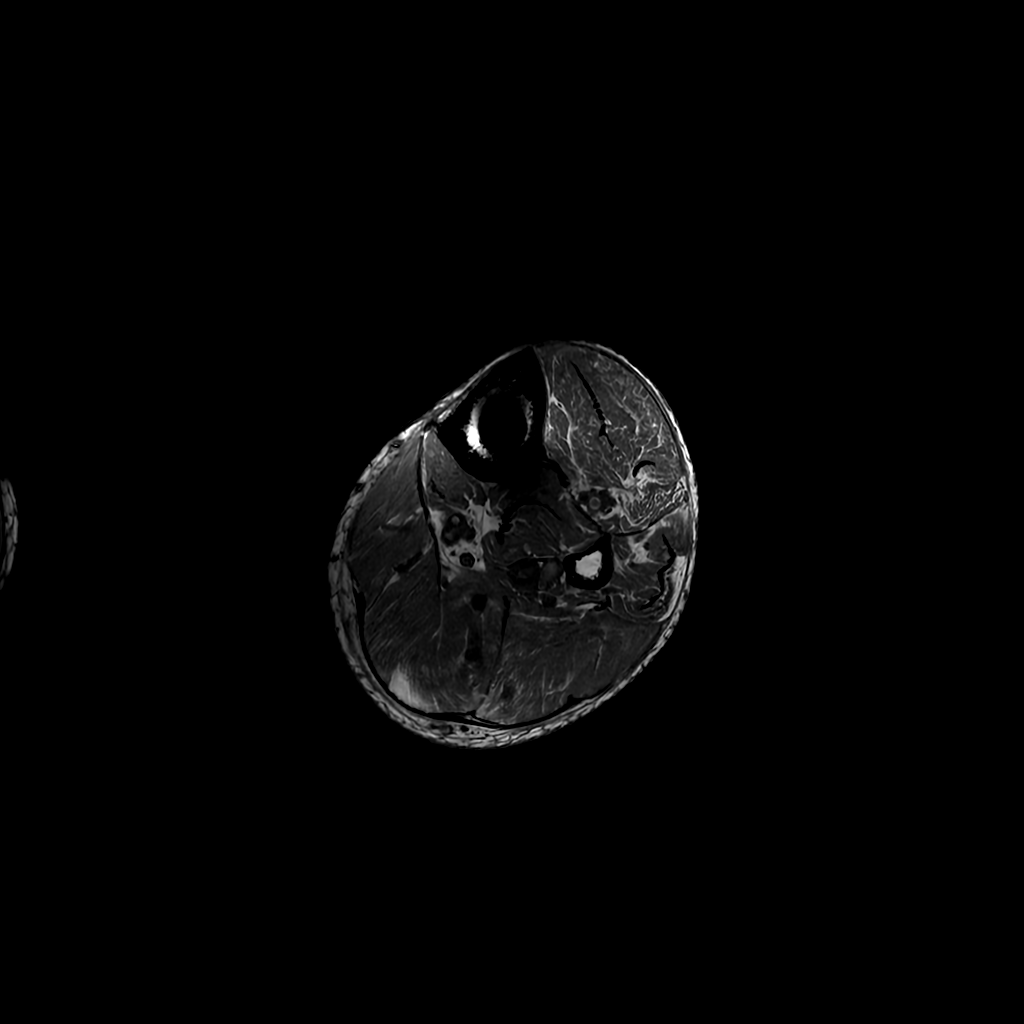
[im 75/75]
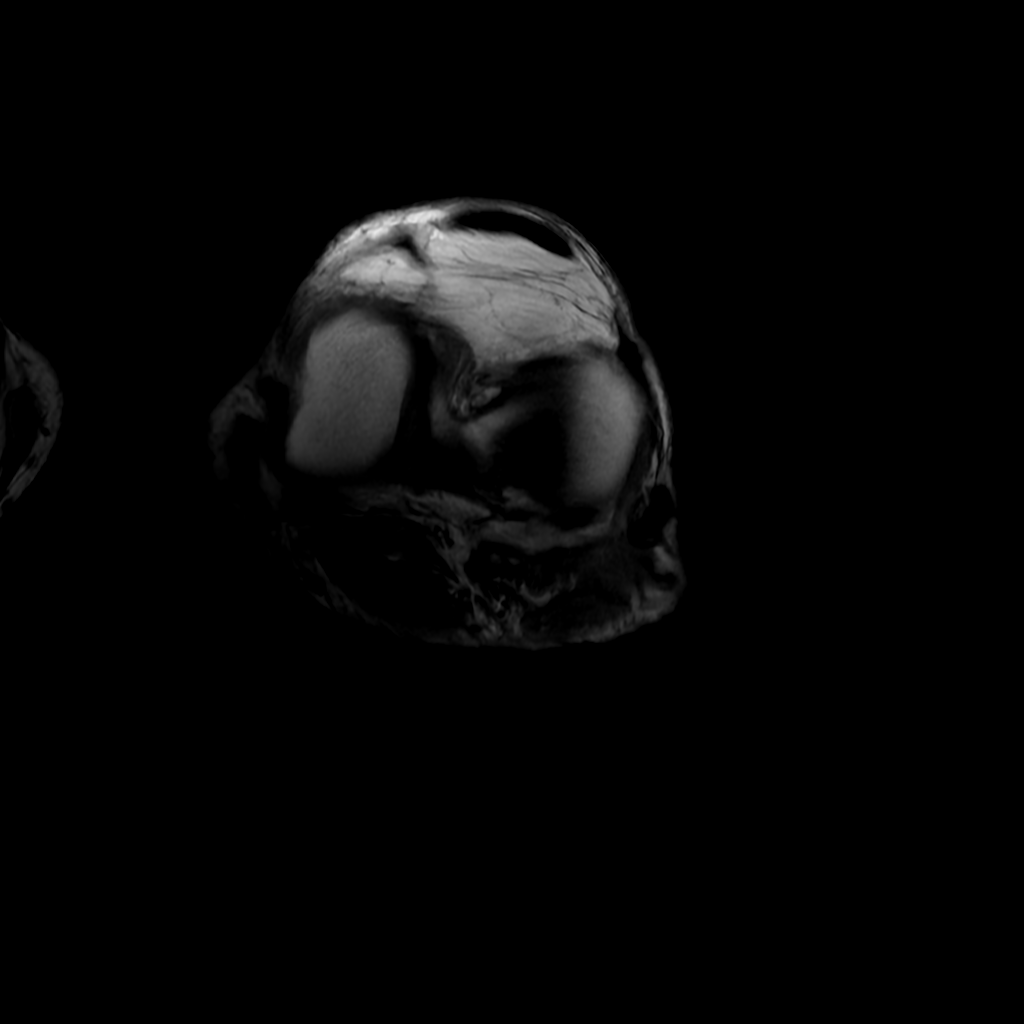

[5 of 40 positions shown; findings below may reference images not displayed]

FINDINGS: Bones/Joint/Cartilage

Both lower legs are included on the coronal images. There are
bilateral tibial intramedullary nails with associated posttraumatic
deformities of the proximal tibial shafts. There are old healed
fractures of the proximal right and mid left fibula. No evidence of
acute fracture, suspicious marrow lesion or suspicious marrow
enhancement.

Ligaments

Not relevant for exam/indication.

Muscles and Tendons

There is mildly heterogeneous T2 signal within the left calf
musculature which may in part be artifactual. No focal intramuscular
fluid collection or abnormal enhancement identified.

Soft tissues

Capsules were placed around the palpable concern posterolaterally in
the distal left lower leg. There is an underlying subcutaneous mass
corresponding with the lesion seen on prior ultrasound. This
demonstrates mildly heterogeneous T2 hyperintensity, homogeneous T1
signal which is slightly hyperintense to muscle and intense
homogeneous enhancement following contrast. This lesion measures
approximately 3.4 x 2.7 x 1.2 cm and is posterior to the distal
fibula and peroneal tendons, approximately 10 cm above the ankle
joint. No other soft tissue masses are identified.
IMPRESSION: 1. The patient's palpable concern corresponds with a subcutaneous
mass posterolaterally in the distal left lower leg with intense
homogeneous enhancement following contrast. This finding is
worrisome for malignancy with primary considerations including
lymphoma, sarcoma and metastatic disease. Tissue sampling/excisional
biopsy recommended.
2. No other masses or adenopathy.
3. Healed lower leg fractures bilaterally post bilateral tibial
intramedullary nail placement. No acute osseous findings.

## 2021-03-16 DIAGNOSIS — E119 Type 2 diabetes mellitus without complications: Secondary | ICD-10-CM | POA: Diagnosis not present

## 2021-03-16 DIAGNOSIS — D510 Vitamin B12 deficiency anemia due to intrinsic factor deficiency: Secondary | ICD-10-CM | POA: Diagnosis not present

## 2021-03-17 DIAGNOSIS — R918 Other nonspecific abnormal finding of lung field: Secondary | ICD-10-CM | POA: Diagnosis not present

## 2021-03-17 DIAGNOSIS — D649 Anemia, unspecified: Secondary | ICD-10-CM | POA: Diagnosis not present

## 2021-03-17 DIAGNOSIS — C499 Malignant neoplasm of connective and soft tissue, unspecified: Secondary | ICD-10-CM | POA: Diagnosis not present

## 2021-03-17 DIAGNOSIS — C4922 Malignant neoplasm of connective and soft tissue of left lower limb, including hip: Secondary | ICD-10-CM | POA: Diagnosis not present

## 2021-03-17 DIAGNOSIS — R52 Pain, unspecified: Secondary | ICD-10-CM | POA: Diagnosis not present

## 2021-03-17 DIAGNOSIS — Z79899 Other long term (current) drug therapy: Secondary | ICD-10-CM | POA: Diagnosis not present

## 2021-03-17 DIAGNOSIS — Z9221 Personal history of antineoplastic chemotherapy: Secondary | ICD-10-CM | POA: Diagnosis not present

## 2021-03-23 ENCOUNTER — Other Ambulatory Visit: Payer: Self-pay

## 2021-03-23 NOTE — Patient Outreach (Signed)
[facility]) Care Management  03/23/2021  Bryan Vincent 20-Jan-1946 [id number]     Transition of Care Referral  Referral Date: 03/23/2021 Referral Source: Discharge Report Date of Discharge: 03/20/2021 Facility: [facility]     Unsuccessful outreach attempt # 1 to patient.     Plan: RN CM will make outreach attempt to patient within 4 business days.    [name], RN,BSN,CCM [place] Management Telephonic Care Management Coordinator Direct Phone: [phone] Toll Free: [phone] Fax: [phone]

## 2021-03-23 NOTE — Patient Outreach (Signed)
[facility]) Care Management  03/23/2021  Bryan Vincent May 09, 1945 [id number]   [employer] member referral from SNF for transition of care needs. Request assigned to [name], RN for follow up.  Ina Homes [facility] Management Assistant [phone]

## 2021-03-24 ENCOUNTER — Other Ambulatory Visit: Payer: Self-pay

## 2021-03-24 NOTE — Patient Outreach (Signed)
[facility]) Care Management  03/24/2021  Wilmon Conover Blincoe 26-Jun-1945 [id number]   Transition of Care Referral   Referral Date: 03/23/2021 Referral Source: Discharge Report Date of Discharge: 03/20/2021 Facility: [facility]    Unsuccessful outreach attempt #2.   Plan: RN CM will make outreach attempt within 4 business days.   [name], RN,BSN,CCM [facility] Management Telephonic Care Management Coordinator Direct Phone: [phone] Toll Free: [phone] Fax: [phone]

## 2021-03-25 ENCOUNTER — Other Ambulatory Visit: Payer: Self-pay

## 2021-03-25 DIAGNOSIS — C4922 Malignant neoplasm of connective and soft tissue of left lower limb, including hip: Secondary | ICD-10-CM | POA: Diagnosis not present

## 2021-03-25 DIAGNOSIS — G893 Neoplasm related pain (acute) (chronic): Secondary | ICD-10-CM | POA: Diagnosis not present

## 2021-03-25 DIAGNOSIS — Z79891 Long term (current) use of opiate analgesic: Secondary | ICD-10-CM | POA: Diagnosis not present

## 2021-03-25 DIAGNOSIS — R1031 Right lower quadrant pain: Secondary | ICD-10-CM | POA: Diagnosis not present

## 2021-03-25 DIAGNOSIS — C7800 Secondary malignant neoplasm of unspecified lung: Secondary | ICD-10-CM | POA: Diagnosis not present

## 2021-03-25 DIAGNOSIS — C4921 Malignant neoplasm of connective and soft tissue of right lower limb, including hip: Secondary | ICD-10-CM | POA: Diagnosis not present

## 2021-03-25 DIAGNOSIS — C499 Malignant neoplasm of connective and soft tissue, unspecified: Secondary | ICD-10-CM | POA: Diagnosis not present

## 2021-03-25 DIAGNOSIS — Z79899 Other long term (current) drug therapy: Secondary | ICD-10-CM | POA: Diagnosis not present

## 2021-03-25 NOTE — Patient Outreach (Signed)
[facility]) Care Management  03/25/2021  Rosalie Gelpi [place] [birth year] [id number]   Transition of Care Referral   Referral Date: 03/23/2021 Referral Source: Discharge Report Date of Discharge: 03/20/2021 Facility: [facility]    Unsuccessful outreach attempt #3 to patient.       Plan:  RN CM will make outreach attempt to patient within 3-4 wks if no response from letter mailed to patient.  [name], RN,BSN,CCM [place] Management Telephonic Care Management Coordinator Direct Phone: [phone] Toll Free: [phone] Fax: [phone]

## 2021-03-30 DIAGNOSIS — I82402 Acute embolism and thrombosis of unspecified deep veins of left lower extremity: Secondary | ICD-10-CM | POA: Diagnosis not present

## 2021-03-30 DIAGNOSIS — N189 Chronic kidney disease, unspecified: Secondary | ICD-10-CM | POA: Diagnosis not present

## 2021-03-30 DIAGNOSIS — C499 Malignant neoplasm of connective and soft tissue, unspecified: Secondary | ICD-10-CM | POA: Diagnosis not present

## 2021-03-30 DIAGNOSIS — G893 Neoplasm related pain (acute) (chronic): Secondary | ICD-10-CM | POA: Diagnosis not present

## 2021-03-31 DIAGNOSIS — C4922 Malignant neoplasm of connective and soft tissue of left lower limb, including hip: Secondary | ICD-10-CM | POA: Diagnosis not present

## 2021-03-31 DIAGNOSIS — C7800 Secondary malignant neoplasm of unspecified lung: Secondary | ICD-10-CM | POA: Diagnosis not present

## 2021-03-31 DIAGNOSIS — R1084 Generalized abdominal pain: Secondary | ICD-10-CM | POA: Diagnosis not present

## 2021-03-31 DIAGNOSIS — R918 Other nonspecific abnormal finding of lung field: Secondary | ICD-10-CM | POA: Diagnosis not present

## 2021-03-31 DIAGNOSIS — K7689 Other specified diseases of liver: Secondary | ICD-10-CM | POA: Diagnosis not present

## 2021-03-31 DIAGNOSIS — Z9221 Personal history of antineoplastic chemotherapy: Secondary | ICD-10-CM | POA: Diagnosis not present

## 2021-03-31 DIAGNOSIS — C499 Malignant neoplasm of connective and soft tissue, unspecified: Secondary | ICD-10-CM | POA: Diagnosis not present

## 2021-04-06 DIAGNOSIS — C4922 Malignant neoplasm of connective and soft tissue of left lower limb, including hip: Secondary | ICD-10-CM | POA: Diagnosis not present

## 2021-04-06 DIAGNOSIS — N189 Chronic kidney disease, unspecified: Secondary | ICD-10-CM | POA: Diagnosis not present

## 2021-04-06 DIAGNOSIS — C7800 Secondary malignant neoplasm of unspecified lung: Secondary | ICD-10-CM | POA: Diagnosis not present

## 2021-04-07 DIAGNOSIS — I10 Essential (primary) hypertension: Secondary | ICD-10-CM | POA: Diagnosis not present

## 2021-04-07 DIAGNOSIS — Z20822 Contact with and (suspected) exposure to covid-19: Secondary | ICD-10-CM | POA: Diagnosis not present

## 2021-04-07 DIAGNOSIS — C499 Malignant neoplasm of connective and soft tissue, unspecified: Secondary | ICD-10-CM | POA: Diagnosis not present

## 2021-04-07 DIAGNOSIS — I69220 Aphasia following other nontraumatic intracranial hemorrhage: Secondary | ICD-10-CM | POA: Diagnosis not present

## 2021-04-07 DIAGNOSIS — I96 Gangrene, not elsewhere classified: Secondary | ICD-10-CM | POA: Diagnosis not present

## 2021-04-07 DIAGNOSIS — L89899 Pressure ulcer of other site, unspecified stage: Secondary | ICD-10-CM | POA: Diagnosis not present

## 2021-04-07 DIAGNOSIS — C4921 Malignant neoplasm of connective and soft tissue of right lower limb, including hip: Secondary | ICD-10-CM | POA: Diagnosis not present

## 2021-04-07 DIAGNOSIS — E162 Hypoglycemia, unspecified: Secondary | ICD-10-CM | POA: Diagnosis not present

## 2021-04-07 DIAGNOSIS — E871 Hypo-osmolality and hyponatremia: Secondary | ICD-10-CM | POA: Diagnosis not present

## 2021-04-07 DIAGNOSIS — Z86718 Personal history of other venous thrombosis and embolism: Secondary | ICD-10-CM | POA: Diagnosis not present

## 2021-04-07 DIAGNOSIS — R079 Chest pain, unspecified: Secondary | ICD-10-CM | POA: Diagnosis not present

## 2021-04-07 DIAGNOSIS — T68XXXA Hypothermia, initial encounter: Secondary | ICD-10-CM | POA: Diagnosis not present

## 2021-04-07 DIAGNOSIS — I611 Nontraumatic intracerebral hemorrhage in hemisphere, cortical: Secondary | ICD-10-CM | POA: Diagnosis not present

## 2021-04-07 DIAGNOSIS — R2689 Other abnormalities of gait and mobility: Secondary | ICD-10-CM | POA: Diagnosis not present

## 2021-04-07 DIAGNOSIS — Z720 Tobacco use: Secondary | ICD-10-CM | POA: Diagnosis not present

## 2021-04-07 DIAGNOSIS — L89610 Pressure ulcer of right heel, unstageable: Secondary | ICD-10-CM | POA: Diagnosis not present

## 2021-04-07 DIAGNOSIS — D508 Other iron deficiency anemias: Secondary | ICD-10-CM | POA: Diagnosis not present

## 2021-04-07 DIAGNOSIS — I501 Left ventricular failure: Secondary | ICD-10-CM | POA: Diagnosis not present

## 2021-04-07 DIAGNOSIS — I629 Nontraumatic intracranial hemorrhage, unspecified: Secondary | ICD-10-CM | POA: Diagnosis not present

## 2021-04-07 DIAGNOSIS — Z515 Encounter for palliative care: Secondary | ICD-10-CM | POA: Diagnosis not present

## 2021-04-07 DIAGNOSIS — Z89512 Acquired absence of left leg below knee: Secondary | ICD-10-CM | POA: Diagnosis not present

## 2021-04-07 DIAGNOSIS — C4922 Malignant neoplasm of connective and soft tissue of left lower limb, including hip: Secondary | ICD-10-CM | POA: Diagnosis not present

## 2021-04-07 DIAGNOSIS — E872 Acidosis, unspecified: Secondary | ICD-10-CM | POA: Diagnosis not present

## 2021-04-07 DIAGNOSIS — I517 Cardiomegaly: Secondary | ICD-10-CM | POA: Diagnosis not present

## 2021-04-07 DIAGNOSIS — C7931 Secondary malignant neoplasm of brain: Secondary | ICD-10-CM | POA: Diagnosis not present

## 2021-04-07 DIAGNOSIS — C7802 Secondary malignant neoplasm of left lung: Secondary | ICD-10-CM | POA: Diagnosis not present

## 2021-04-07 DIAGNOSIS — F17210 Nicotine dependence, cigarettes, uncomplicated: Secondary | ICD-10-CM | POA: Diagnosis not present

## 2021-04-07 DIAGNOSIS — I619 Nontraumatic intracerebral hemorrhage, unspecified: Secondary | ICD-10-CM | POA: Diagnosis not present

## 2021-04-07 DIAGNOSIS — C7989 Secondary malignant neoplasm of other specified sites: Secondary | ICD-10-CM | POA: Diagnosis not present

## 2021-04-07 DIAGNOSIS — I618 Other nontraumatic intracerebral hemorrhage: Secondary | ICD-10-CM | POA: Diagnosis not present

## 2021-04-07 DIAGNOSIS — C7801 Secondary malignant neoplasm of right lung: Secondary | ICD-10-CM | POA: Diagnosis not present

## 2021-04-07 DIAGNOSIS — Z66 Do not resuscitate: Secondary | ICD-10-CM | POA: Diagnosis not present

## 2021-04-07 DIAGNOSIS — G893 Neoplasm related pain (acute) (chronic): Secondary | ICD-10-CM | POA: Diagnosis not present

## 2021-04-07 DIAGNOSIS — G936 Cerebral edema: Secondary | ICD-10-CM | POA: Diagnosis not present

## 2021-04-07 DIAGNOSIS — E43 Unspecified severe protein-calorie malnutrition: Secondary | ICD-10-CM | POA: Diagnosis not present

## 2021-04-07 DIAGNOSIS — L89150 Pressure ulcer of sacral region, unstageable: Secondary | ICD-10-CM | POA: Diagnosis not present

## 2021-04-07 DIAGNOSIS — D649 Anemia, unspecified: Secondary | ICD-10-CM | POA: Diagnosis not present

## 2021-04-07 DIAGNOSIS — R9431 Abnormal electrocardiogram [ECG] [EKG]: Secondary | ICD-10-CM | POA: Diagnosis not present

## 2021-04-07 DIAGNOSIS — E440 Moderate protein-calorie malnutrition: Secondary | ICD-10-CM | POA: Diagnosis not present

## 2021-04-07 DIAGNOSIS — Z4659 Encounter for fitting and adjustment of other gastrointestinal appliance and device: Secondary | ICD-10-CM | POA: Diagnosis not present

## 2021-04-07 DIAGNOSIS — G935 Compression of brain: Secondary | ICD-10-CM | POA: Diagnosis not present

## 2021-04-07 DIAGNOSIS — T8789 Other complications of amputation stump: Secondary | ICD-10-CM | POA: Diagnosis not present

## 2021-04-07 DIAGNOSIS — I69251 Hemiplegia and hemiparesis following other nontraumatic intracranial hemorrhage affecting right dominant side: Secondary | ICD-10-CM | POA: Diagnosis not present

## 2021-04-07 DIAGNOSIS — D518 Other vitamin B12 deficiency anemias: Secondary | ICD-10-CM | POA: Diagnosis not present

## 2021-04-07 DIAGNOSIS — E11649 Type 2 diabetes mellitus with hypoglycemia without coma: Secondary | ICD-10-CM | POA: Diagnosis not present

## 2021-04-08 DIAGNOSIS — D649 Anemia, unspecified: Secondary | ICD-10-CM | POA: Diagnosis not present

## 2021-04-09 DIAGNOSIS — R079 Chest pain, unspecified: Secondary | ICD-10-CM | POA: Diagnosis not present

## 2021-04-10 DIAGNOSIS — C499 Malignant neoplasm of connective and soft tissue, unspecified: Secondary | ICD-10-CM | POA: Diagnosis not present

## 2021-04-10 DIAGNOSIS — I96 Gangrene, not elsewhere classified: Secondary | ICD-10-CM | POA: Diagnosis not present

## 2021-04-10 DIAGNOSIS — E162 Hypoglycemia, unspecified: Secondary | ICD-10-CM | POA: Diagnosis not present

## 2021-04-10 DIAGNOSIS — I619 Nontraumatic intracerebral hemorrhage, unspecified: Secondary | ICD-10-CM | POA: Diagnosis not present

## 2021-04-10 DIAGNOSIS — E871 Hypo-osmolality and hyponatremia: Secondary | ICD-10-CM | POA: Diagnosis not present

## 2021-04-10 DIAGNOSIS — I618 Other nontraumatic intracerebral hemorrhage: Secondary | ICD-10-CM | POA: Diagnosis not present

## 2021-04-10 DIAGNOSIS — I10 Essential (primary) hypertension: Secondary | ICD-10-CM | POA: Diagnosis not present

## 2021-04-10 DIAGNOSIS — C4922 Malignant neoplasm of connective and soft tissue of left lower limb, including hip: Secondary | ICD-10-CM | POA: Diagnosis not present

## 2021-04-10 DIAGNOSIS — C4921 Malignant neoplasm of connective and soft tissue of right lower limb, including hip: Secondary | ICD-10-CM | POA: Diagnosis not present

## 2021-04-10 DIAGNOSIS — C7801 Secondary malignant neoplasm of right lung: Secondary | ICD-10-CM | POA: Diagnosis not present

## 2021-04-10 DIAGNOSIS — C7989 Secondary malignant neoplasm of other specified sites: Secondary | ICD-10-CM | POA: Diagnosis not present

## 2021-04-10 DIAGNOSIS — Z720 Tobacco use: Secondary | ICD-10-CM | POA: Diagnosis not present

## 2021-04-10 DIAGNOSIS — E11649 Type 2 diabetes mellitus with hypoglycemia without coma: Secondary | ICD-10-CM | POA: Diagnosis not present

## 2021-04-10 DIAGNOSIS — C7802 Secondary malignant neoplasm of left lung: Secondary | ICD-10-CM | POA: Diagnosis not present

## 2021-04-11 DIAGNOSIS — I501 Left ventricular failure: Secondary | ICD-10-CM | POA: Diagnosis not present

## 2021-04-11 DIAGNOSIS — R9431 Abnormal electrocardiogram [ECG] [EKG]: Secondary | ICD-10-CM | POA: Diagnosis not present

## 2021-04-11 DIAGNOSIS — E871 Hypo-osmolality and hyponatremia: Secondary | ICD-10-CM | POA: Diagnosis not present

## 2021-04-11 DIAGNOSIS — E872 Acidosis, unspecified: Secondary | ICD-10-CM | POA: Diagnosis not present

## 2021-04-11 DIAGNOSIS — I517 Cardiomegaly: Secondary | ICD-10-CM | POA: Diagnosis not present

## 2021-04-11 DIAGNOSIS — I96 Gangrene, not elsewhere classified: Secondary | ICD-10-CM | POA: Diagnosis not present

## 2021-04-11 DIAGNOSIS — I619 Nontraumatic intracerebral hemorrhage, unspecified: Secondary | ICD-10-CM | POA: Diagnosis not present

## 2021-04-11 DIAGNOSIS — E162 Hypoglycemia, unspecified: Secondary | ICD-10-CM | POA: Diagnosis not present

## 2021-04-11 DIAGNOSIS — E43 Unspecified severe protein-calorie malnutrition: Secondary | ICD-10-CM | POA: Diagnosis not present

## 2021-04-11 DIAGNOSIS — C7989 Secondary malignant neoplasm of other specified sites: Secondary | ICD-10-CM | POA: Diagnosis not present

## 2021-04-11 DIAGNOSIS — C499 Malignant neoplasm of connective and soft tissue, unspecified: Secondary | ICD-10-CM | POA: Diagnosis not present

## 2021-04-11 DIAGNOSIS — I618 Other nontraumatic intracerebral hemorrhage: Secondary | ICD-10-CM | POA: Diagnosis not present

## 2021-04-11 DIAGNOSIS — I10 Essential (primary) hypertension: Secondary | ICD-10-CM | POA: Diagnosis not present

## 2021-04-12 DIAGNOSIS — I618 Other nontraumatic intracerebral hemorrhage: Secondary | ICD-10-CM | POA: Diagnosis not present

## 2021-04-12 DIAGNOSIS — E871 Hypo-osmolality and hyponatremia: Secondary | ICD-10-CM | POA: Diagnosis not present

## 2021-04-12 DIAGNOSIS — C499 Malignant neoplasm of connective and soft tissue, unspecified: Secondary | ICD-10-CM | POA: Diagnosis not present

## 2021-04-12 DIAGNOSIS — E162 Hypoglycemia, unspecified: Secondary | ICD-10-CM | POA: Diagnosis not present

## 2021-04-12 DIAGNOSIS — E872 Acidosis, unspecified: Secondary | ICD-10-CM | POA: Diagnosis not present

## 2021-04-12 DIAGNOSIS — I96 Gangrene, not elsewhere classified: Secondary | ICD-10-CM | POA: Diagnosis not present

## 2021-04-12 DIAGNOSIS — C7989 Secondary malignant neoplasm of other specified sites: Secondary | ICD-10-CM | POA: Diagnosis not present

## 2021-04-12 DIAGNOSIS — I10 Essential (primary) hypertension: Secondary | ICD-10-CM | POA: Diagnosis not present

## 2021-04-12 DIAGNOSIS — E43 Unspecified severe protein-calorie malnutrition: Secondary | ICD-10-CM | POA: Diagnosis not present

## 2021-04-12 DIAGNOSIS — I619 Nontraumatic intracerebral hemorrhage, unspecified: Secondary | ICD-10-CM | POA: Diagnosis not present

## 2021-04-13 ENCOUNTER — Other Ambulatory Visit: Payer: Self-pay

## 2021-04-13 NOTE — Patient Outreach (Signed)
[facility]) Care Management ? ?04/13/2021 ? ?Bryan Vincent ?06-May-1945 ?[id number] ? ? ?Transition of Care Referral ?  ?Referral Date: 03/23/2021 ?Referral Source: Discharge Report ?Date of Discharge: 03/20/2021 ?Facility: [facility] ? ? ? ?Multiple attempts to establish contact with patient without success. No response from letter mailed to patient. Case is being closed at this time.  ? ?Plan: ?RN CM will close case. ? ? ?[name], RN,BSN,CCM ?[facility] Care Management ?Telephonic Care Management Coordinator ?Direct Phone: [phone] ?Toll Free: [phone] ?Fax: [phone] ? ?

## 2021-04-14 DIAGNOSIS — Z4659 Encounter for fitting and adjustment of other gastrointestinal appliance and device: Secondary | ICD-10-CM | POA: Diagnosis not present

## 2021-04-15 DIAGNOSIS — I618 Other nontraumatic intracerebral hemorrhage: Secondary | ICD-10-CM | POA: Diagnosis not present

## 2021-04-15 DIAGNOSIS — G893 Neoplasm related pain (acute) (chronic): Secondary | ICD-10-CM | POA: Diagnosis not present

## 2021-04-15 DIAGNOSIS — Z89512 Acquired absence of left leg below knee: Secondary | ICD-10-CM | POA: Diagnosis not present

## 2021-04-15 DIAGNOSIS — C499 Malignant neoplasm of connective and soft tissue, unspecified: Secondary | ICD-10-CM | POA: Diagnosis not present

## 2021-04-15 DIAGNOSIS — L89899 Pressure ulcer of other site, unspecified stage: Secondary | ICD-10-CM | POA: Diagnosis not present

## 2021-04-15 DIAGNOSIS — L89150 Pressure ulcer of sacral region, unstageable: Secondary | ICD-10-CM | POA: Diagnosis not present

## 2021-04-15 DIAGNOSIS — C7989 Secondary malignant neoplasm of other specified sites: Secondary | ICD-10-CM | POA: Diagnosis not present

## 2021-04-15 DIAGNOSIS — E162 Hypoglycemia, unspecified: Secondary | ICD-10-CM | POA: Diagnosis not present

## 2021-04-15 DIAGNOSIS — E440 Moderate protein-calorie malnutrition: Secondary | ICD-10-CM | POA: Diagnosis not present

## 2021-04-15 DIAGNOSIS — T68XXXA Hypothermia, initial encounter: Secondary | ICD-10-CM | POA: Diagnosis not present

## 2021-04-15 DIAGNOSIS — E871 Hypo-osmolality and hyponatremia: Secondary | ICD-10-CM | POA: Diagnosis not present

## 2021-04-15 DIAGNOSIS — I10 Essential (primary) hypertension: Secondary | ICD-10-CM | POA: Diagnosis not present

## 2021-04-15 DIAGNOSIS — L89610 Pressure ulcer of right heel, unstageable: Secondary | ICD-10-CM | POA: Diagnosis not present

## 2021-04-15 DIAGNOSIS — E43 Unspecified severe protein-calorie malnutrition: Secondary | ICD-10-CM | POA: Diagnosis not present

## 2021-04-15 DIAGNOSIS — T8789 Other complications of amputation stump: Secondary | ICD-10-CM | POA: Diagnosis not present

## 2021-04-15 DIAGNOSIS — I619 Nontraumatic intracerebral hemorrhage, unspecified: Secondary | ICD-10-CM | POA: Diagnosis not present

## 2021-04-20 ENCOUNTER — Encounter: Payer: Self-pay | Admitting: Gastroenterology

## 2021-05-09 DEATH — deceased
